# Patient Record
Sex: Male | Born: 1970 | Race: Black or African American | Hispanic: No | Smoking: Never smoker
Health system: Southern US, Community
[De-identification: ages and names within clinical notes are randomized; demographics above are authoritative.]

## PROBLEM LIST (undated history)

## (undated) ENCOUNTER — Emergency Department (HOSPITAL_COMMUNITY): Admission: EM | Payer: Self-pay | Source: Home / Self Care

## (undated) HISTORY — PX: HAND SURGERY: SHX662

---

## 2006-02-15 ENCOUNTER — Emergency Department: Payer: Self-pay | Admitting: Emergency Medicine

## 2006-03-15 ENCOUNTER — Emergency Department: Payer: Self-pay | Admitting: Emergency Medicine

## 2006-08-10 ENCOUNTER — Emergency Department: Payer: Self-pay | Admitting: Emergency Medicine

## 2006-12-07 ENCOUNTER — Emergency Department: Payer: Self-pay | Admitting: Unknown Physician Specialty

## 2007-02-01 ENCOUNTER — Emergency Department: Payer: Self-pay | Admitting: Emergency Medicine

## 2007-12-25 ENCOUNTER — Emergency Department: Payer: Self-pay | Admitting: Emergency Medicine
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
## 2008-06-15 ENCOUNTER — Emergency Department: Payer: Self-pay | Admitting: Emergency Medicine

## 2008-12-10 ENCOUNTER — Emergency Department: Payer: Self-pay | Admitting: Emergency Medicine

## 2009-05-19 ENCOUNTER — Emergency Department: Payer: Self-pay | Admitting: Internal Medicine

## 2009-06-05 ENCOUNTER — Emergency Department: Payer: Self-pay | Admitting: Emergency Medicine

## 2010-02-08 ENCOUNTER — Encounter: Payer: Self-pay | Admitting: Orthopedic Surgery

## 2010-02-15 ENCOUNTER — Encounter: Payer: Self-pay | Admitting: Orthopedic Surgery

## 2010-03-18 ENCOUNTER — Encounter: Payer: Self-pay | Admitting: Orthopedic Surgery

## 2010-04-05 ENCOUNTER — Emergency Department: Payer: Self-pay | Admitting: Emergency Medicine

## 2010-07-03 ENCOUNTER — Emergency Department: Payer: Self-pay | Admitting: Internal Medicine

## 2011-11-19 ENCOUNTER — Emergency Department: Payer: Self-pay | Admitting: Emergency Medicine

## 2012-09-23 ENCOUNTER — Emergency Department: Payer: Self-pay | Admitting: Emergency Medicine

## 2013-11-01 ENCOUNTER — Emergency Department: Payer: Self-pay | Admitting: Emergency Medicine
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
## 2015-08-25 ENCOUNTER — Emergency Department
Admission: EM | Admit: 2015-08-25 | Discharge: 2015-08-25 | Disposition: A | Discharge status: Home / Self Care | Payer: Self-pay | Attending: Emergency Medicine | Admitting: Emergency Medicine

## 2015-08-25 DIAGNOSIS — G5691 Unspecified mononeuropathy of right upper limb: Secondary | ICD-10-CM | POA: Insufficient documentation

## 2015-08-25 MED ORDER — MELOXICAM 15 MG PO TABS: 15.0000 mg | ORAL_TABLET | Freq: Every day | ORAL | Status: DC

## 2015-08-25 MED ORDER — PREDNISONE 10 MG (21) PO TBPK: ORAL_TABLET | ORAL | Status: DC

## 2015-08-25 NOTE — ED Provider Notes (Signed)
[facility] Emergency Department Provider Note ____________________________________________  Time seen: Approximately 11:19 PM  I have reviewed the triage vital signs and the nursing notes.   HISTORY  Chief Complaint Hand Pain   HPI ALDYN TOON is a 44 y.o. male that required reconstructive surgery. He states that over the past month he has had an increase in pain and stiffness. He is losing his ability to grip things and today it caused him to drop a [place] cabinet while at work.   No past medical history on file.  There are no active problems to display for this patient.   No past surgical history on file.  Current Outpatient Rx  Name  Route  Sig  Dispense  Refill  . meloxicam (MOBIC) 15 MG tablet   Oral   Take 1 tablet (15 mg total) by mouth daily.   30 tablet   2   . predniSONE (STERAPRED UNI-PAK 21 TAB) 10 MG (21) TBPK tablet      Take 6 tablets on day 1 Take 5 tablets on day 2 Take 4 tablets on day 3 Take 3 tablets on day 4 Take 2 tablets on day 5 Take 1 tablet on day 6   21 tablet   0     Allergies Review of patient's allergies indicates no known allergies.  No family history on file.  Social History Social History  Substance Use Topics  . Smoking status: Not on file  . Smokeless tobacco: Not on file  . Alcohol Use: Not on file    Review of Systems Constitutional: No recent illness. Eyes: No visual changes. ENT: No sore throat. Cardiovascular: Denies chest pain or palpitations. Respiratory: Denies shortness of breath. Gastrointestinal: No abdominal pain.  Genitourinary: Negative for dysuria. Musculoskeletal: Pain in  right hand: Negative for rash. Neurological: Negative for headaches, focal weakness or numbness. 10-point ROS otherwise negative.  ____________________________________________   PHYSICAL EXAM:  VITAL SIGNS: ED Triage Vitals  Enc Vitals Group     BP 08/25/15 2108 123/92 mmHg     Pulse Rate  08/25/15 2108 92     Resp 08/25/15 2108 18     Temp 08/25/15 2108 98 F (36.7 C)     Temp Source 08/25/15 2108 Oral     SpO2 08/25/15 2108 100 %     Weight 08/25/15 2108 160 lb (72.576 kg)     Height 08/25/15 2108  (1.676 m)     Head Cir --      Peak Flow --      Pain Score 08/25/15 2108 6     Pain Loc --      Pain Edu? --      Excl. in GC? --     Constitutional: Alert and oriented. Well appearing and in no acute distress. Eyes: Conjunctivae are normal. EOMI. Head: Atraumatic. Nose: No congestion/rhinnorhea. Neck: No stridor.  Respiratory: Normal respiratory effort.   Musculoskeletal: Right hand with limited range of motion at baseline. Neurologic:  Normal speech and language. No gross focal neurologic deficits are appreciated. Speech is normal. No gait instability.  Skin:  Skin is warm, dry and intact. Atraumatic. Surgical scars noted to the right hand and wrist  Psychiatric: Mood and affect are normal. Speech and behavior are normal.  ____________________________________________   LABS (all labs ordered are listed, but only abnormal results are displayed)  Labs Reviewed - No data to display ____________________________________________  RADIOLOGYnot indicated  ____________________________________________   PROCEDURES  Procedure(s) performed:  Velcro cock-up splint applied to right hand by Raynelle Fanning, ER Tech.    ____________________________________________   INITIAL IMPRESSION / ASSESSMENT AND PLAN / ED COURSE  Pertinent labs & imaging results that were available during my care of the patient were reviewed by me and considered in my medical decision making (see chart for details).  Patient was encouraged to follow up with orthopedics. He would benefit greatly from physical therapy. He was advised to avoid lifting more than 25 pounds until he is able to retain range of motion of his hand. He was advised to return to the emergency department for symptoms that change  or worsen if he is unable schedule an appointment _________________________________________   FINAL CLINICAL IMPRESSION(S) / ED DIAGNOSES  Final diagnoses:  Neuropathy of hand, right       Chinita Pester, FNP 08/25/15 2332  Minna Antis, MD 08/26/15 [phone]

## 2015-08-25 NOTE — ED Notes (Signed)
Patient works for a [employer]. Began having trouble with his right hand grip about 45 days. Today hand locked up on him.

## 2015-08-25 NOTE — ED Notes (Signed)
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
## 2017-07-22 ENCOUNTER — Emergency Department
Admission: EM | Admit: 2017-07-22 | Discharge: 2017-07-22 | Disposition: A | Discharge status: Home / Self Care | Payer: Self-pay | Attending: Emergency Medicine | Admitting: Emergency Medicine

## 2017-07-22 DIAGNOSIS — Z79899 Other long term (current) drug therapy: Secondary | ICD-10-CM | POA: Insufficient documentation

## 2017-07-22 DIAGNOSIS — M545 Low back pain: Secondary | ICD-10-CM | POA: Insufficient documentation

## 2017-07-22 DIAGNOSIS — R109 Unspecified abdominal pain: Secondary | ICD-10-CM

## 2017-07-22 LAB — URINALYSIS, COMPLETE (UACMP) WITH MICROSCOPIC
BILIRUBIN URINE: NEGATIVE
Bacteria, UA: NONE SEEN
GLUCOSE, UA: NEGATIVE mg/dL
Hgb urine dipstick: NEGATIVE
KETONES UR: 5 mg/dL — AB
Leukocytes, UA: NEGATIVE
NITRITE: NEGATIVE
PH: 5 (ref 5.0–8.0)
Protein, ur: NEGATIVE mg/dL
Specific Gravity, Urine: 1.023 (ref 1.005–1.030)

## 2017-07-22 MED ORDER — TRAMADOL HCL 50 MG PO TABS: 50.0000 mg | ORAL_TABLET | Freq: Four times a day (QID) | ORAL | 0 refills | Status: AC | PRN

## 2017-07-22 MED ORDER — CYCLOBENZAPRINE HCL 10 MG PO TABS: 10.0000 mg | ORAL_TABLET | Freq: Three times a day (TID) | ORAL | 0 refills | Status: DC | PRN

## 2017-07-22 MED ORDER — CYCLOBENZAPRINE HCL 10 MG PO TABS
10.0000 mg | ORAL_TABLET | Freq: Once | ORAL | Status: AC
  Administered 2017-07-22: 10 mg via ORAL
  Filled 2017-07-22: qty 1

## 2017-07-22 MED ORDER — TRAMADOL HCL 50 MG PO TABS
50.0000 mg | ORAL_TABLET | Freq: Once | ORAL | Status: AC
Start: 2017-07-22 — End: 2017-07-22
  Administered 2017-07-22: 50 mg via ORAL
  Filled 2017-07-22: qty 1

## 2017-07-22 MED ORDER — IBUPROFEN 800 MG PO TABS
800.0000 mg | ORAL_TABLET | Freq: Once | ORAL | Status: AC
  Administered 2017-07-22: 800 mg via ORAL
  Filled 2017-07-22: qty 1

## 2017-07-22 MED ORDER — IBUPROFEN 600 MG PO TABS: 600.0000 mg | ORAL_TABLET | Freq: Three times a day (TID) | ORAL | 0 refills | Status: DC | PRN

## 2017-07-22 NOTE — ED Triage Notes (Signed)
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 Pt denies pain with urination, pt states that he can move a certain way and make the pain worse

## 2017-07-22 NOTE — ED Provider Notes (Signed)
[facility] Emergency Department Provider Note   ____________________________________________   First MD Initiated Contact with Patient 07/22/17 2012     (approximate)  I have reviewed the triage vital signs and the nursing notes.   HISTORY  Chief Complaint Flank Pain    HPI Bradley Chavez is a 46 y.o. male patient complaining of right flank pain for 2 days. 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 Patient also states urinary frequency but denies dysuria or urethral discharge. Patient states pain increase with certain movements.Patient rates his pain as a 6/10. Patient describes the pain as "achy". Patient denies radicular component to this back pain. No palliative measures for complaint.    No past medical history on file.  There are no active problems to display for this patient.   No past surgical history on file.  Prior to Admission medications   Medication Sig Start Date End Date Taking? Authorizing Provider  cyclobenzaprine (FLEXERIL) 10 MG tablet Take 1 tablet (10 mg total) by mouth 3 (three) times daily as needed. 07/22/17   Joni ReiningSmith, Edilia Ghuman K, PA-C  ibuprofen (ADVIL,MOTRIN) 600 MG tablet Take 1 tablet (600 mg total) by mouth every 8 (eight) hours as needed. 07/22/17   Joni ReiningSmith, Daeshaun Specht K, PA-C  meloxicam (MOBIC) 15 MG tablet Take 1 tablet (15 mg total) by mouth daily. 08/25/15   Triplett, Cari B, FNP  predniSONE (STERAPRED UNI-PAK 21 TAB) 10 MG (21) TBPK tablet Take 6 tablets on day 1 Take 5 tablets on day 2 Take 4 tablets on day 3 Take 3 tablets on day 4 Take 2 tablets on day 5 Take 1 tablet on day 6 08/25/15   Triplett, Cari B, FNP  traMADol (ULTRAM) 50 MG tablet Take 1 tablet (50 mg total) by mouth every 6 (six) hours as needed. 07/22/17 07/22/18  Joni ReiningSmith, Khayla Koppenhaver K, PA-C    Allergies Patient has no known allergies.  No family history on file.  Social History Social History  Substance Use Topics    . Smoking status: Not on file  . Smokeless tobacco: Not on file  . Alcohol use Not on file    Review of Systems  Constitutional: No fever/chills Eyes: No visual changes. ENT: No sore throat. Cardiovascular: Denies chest pain. Respiratory: Denies shortness of breath. Gastrointestinal: No abdominal pain.  No nausea, no vomiting.  No diarrhea.  No constipation. Genitourinary: Negative for dysuria. Musculoskeletal: Right flank pain. Skin: Negative for rash. Neurological: Negative for headaches, focal weakness or numbness.   ____________________________________________   PHYSICAL EXAM:  VITAL SIGNS: ED Triage Vitals  Enc Vitals Group     BP 07/22/17 1949 134/68     Pulse Rate 07/22/17 1949 93     Resp 07/22/17 1949 18     Temp 07/22/17 1949 98.6 F (37 C)     Temp Source 07/22/17 1949 Oral     SpO2 07/22/17 1949 97 %     Weight 07/22/17 1949 165 lb (74.8 kg)     Height 07/22/17 1949 5\' 7"  (1.702 m)     Head Circumference --      Peak Flow --      Pain Score 07/22/17 1948 6     Pain Loc --      Pain Edu? --      Excl. in GC? --     Constitutional: Alert and oriented. Well appearing and in no acute distress. Eyes: Conjunctivae are normal. PERRL. EOMI. Cardiovascular: Normal  rate, regular rhythm. Grossly normal heart sounds.  Good peripheral circulation. Respiratory: Normal respiratory effort.  No retractions. Lungs CTAB. Gastrointestinal: Soft and nontender. No distention. No abdominal bruits. No CVA tenderness. Genitourinary: Deferred  Musculoskeletal: No obvious spinal deformity. No CVA guarding. Patient decreased range of motion right lateral flexion movements. Patient has negative straight leg test. Paraspinal muscle spasm elicited with flexion movements. Neurologic:  Normal speech and language. No gross focal neurologic deficits are appreciated. No gait instability. Skin:  Skin is warm, dry and intact. No rash noted. Psychiatric: Mood and affect are normal. Speech  and behavior are normal.  ____________________________________________   LABS (all labs ordered are listed, but only abnormal results are displayed)  Labs Reviewed  URINALYSIS, COMPLETE (UACMP) WITH MICROSCOPIC - Abnormal; Notable for the following:       Result Value   Color, Urine YELLOW (*)    APPearance CLEAR (*)    Ketones, ur 5 (*)    Squamous Epithelial / LPF 0-5 (*)    All other components within normal limits   ____________________________________________  EKG   ____________________________________________  RADIOLOGY  No results found.  ____________________________________________   PROCEDURES  Procedure(s) performed: None  Procedures  Critical Care performed: No  ____________________________________________   INITIAL IMPRESSION / ASSESSMENT AND PLAN / ED COURSE  Pertinent labs & imaging results that were available during my care of the patient were reviewed by me and considered in my medical decision making (see chart for details).  Patient present with right flank pain. Patient has a history of recurrent back pain secondary to repetitive heavy lifting. Patient urinalysis unremarkable. Patient given discharge care instructions and advised take medication as directed. Patient [facility] also [facility].      ____________________________________________   FINAL CLINICAL IMPRESSION(S) / ED DIAGNOSES  Final diagnoses:  Right flank pain      NEW MEDICATIONS STARTED DURING THIS VISIT:  Discharge Medication List as of 07/22/2017  8:22 PM    START taking these medications   Details  cyclobenzaprine (FLEXERIL) 10 MG tablet Take 1 tablet (10 mg total) by mouth 3 (three) times daily as needed., Starting Sun 07/22/2017, Print    ibuprofen (ADVIL,MOTRIN) 600 MG tablet Take 1 tablet (600 mg total) by mouth every 8 (eight) hours as needed., Starting Sun 07/22/2017, Print    traMADol (ULTRAM) 50 MG tablet Take 1  tablet (50 mg total) by mouth every 6 (six) hours as needed., Starting Sun 07/22/2017, Until Mon 07/22/2018, Print         Note:  This document was prepared using Dragon voice recognition software and may include unintentional dictation errors.    Joni ReiningSmith, Ronetta Molla K, PA-C 07/22/17 2035    Pershing ProudSchaevitz, Myra Rudeavid Matthew, MD 07/22/17 2224

## 2017-07-22 NOTE — ED Notes (Signed)
Pt complains of right lower back pain "off and on for a while, but it's worse today". Pt states pain is worse with bending, lifting, twisting. Pt denies pain down legs or change in sensation to extremities. resps unlabored. Pt denies dysuria.

## 2017-08-05 ENCOUNTER — Emergency Department
Admission: EM | Admit: 2017-08-05 | Discharge: 2017-08-05 | Disposition: A | Discharge status: Home / Self Care | Payer: Self-pay | Attending: Emergency Medicine | Admitting: Emergency Medicine

## 2017-08-05 DIAGNOSIS — K056 Periodontal disease, unspecified: Secondary | ICD-10-CM | POA: Insufficient documentation

## 2017-08-05 DIAGNOSIS — K0889 Other specified disorders of teeth and supporting structures: Secondary | ICD-10-CM | POA: Insufficient documentation

## 2017-08-05 DIAGNOSIS — Z79899 Other long term (current) drug therapy: Secondary | ICD-10-CM | POA: Insufficient documentation

## 2017-08-05 DIAGNOSIS — K047 Periapical abscess without sinus: Secondary | ICD-10-CM

## 2017-08-05 LAB — BASIC METABOLIC PANEL
ANION GAP: 9 (ref 5–15)
BUN: 5 mg/dL — ABNORMAL LOW (ref 6–20)
CHLORIDE: 105 mmol/L (ref 101–111)
CO2: 23 mmol/L (ref 22–32)
Calcium: 9.2 mg/dL (ref 8.9–10.3)
Creatinine, Ser: 1.05 mg/dL (ref 0.61–1.24)
GFR calc Af Amer: >60 mL/min (ref >60)
GFR calc non Af Amer: >60 mL/min (ref >60)
Glucose, Bld: 92 mg/dL (ref 65–99)
POTASSIUM: 4 mmol/L (ref 3.5–5.1)
Sodium: 137 mmol/L (ref 135–145)

## 2017-08-05 LAB — CBC WITH DIFFERENTIAL/PLATELET
BASOS ABS: 0.1 10*3/uL (ref 0–0.1)
Basophils Relative: 0 %
Eosinophils Absolute: 0.1 10*3/uL (ref 0–0.7)
Eosinophils Relative: 0 %
HEMATOCRIT: 46.6 % (ref 40.0–52.0)
Hemoglobin: 15.6 g/dL (ref 13.0–18.0)
LYMPHS ABS: 3.4 10*3/uL (ref 1.0–3.6)
Lymphocytes Relative: 18 %
MCH: 30.9 pg (ref 26.0–34.0)
MCHC: 33.5 g/dL (ref 32.0–36.0)
MCV: 92.1 fL (ref 80.0–100.0)
Monocytes Absolute: 1.4 10*3/uL — ABNORMAL HIGH (ref 0.2–1.0)
Monocytes Relative: 7 %
NEUTROS ABS: 13.7 10*3/uL — AB (ref 1.4–6.5)
Neutrophils Relative %: 75 %
Platelets: 212 10*3/uL (ref 150–440)
RBC: 5.06 MIL/uL (ref 4.40–5.90)
RDW: 13.4 % (ref 11.5–14.5)
WBC: 18.6 10*3/uL — AB (ref 3.8–10.6)

## 2017-08-05 LAB — LACTIC ACID, PLASMA: LACTIC ACID, VENOUS: 1.7 mmol/L (ref 0.5–1.9)

## 2017-08-05 MED ORDER — PENICILLIN V POTASSIUM 500 MG PO TABS: 500.0000 mg | ORAL_TABLET | Freq: Four times a day (QID) | ORAL | 0 refills | Status: DC

## 2017-08-05 MED ORDER — SODIUM CHLORIDE 0.9 % IV BOLUS (SEPSIS)
1000.0000 mL | Freq: Once | INTRAVENOUS | Status: AC
  Administered 2017-08-05: 1000 mL via INTRAVENOUS

## 2017-08-05 MED ORDER — TRAMADOL HCL 50 MG PO TABS: 50.0000 mg | ORAL_TABLET | Freq: Four times a day (QID) | ORAL | 0 refills | Status: AC | PRN

## 2017-08-05 MED ORDER — PENICILLIN V POTASSIUM 250 MG PO TABS
500.0000 mg | ORAL_TABLET | Freq: Once | ORAL | Status: AC
  Administered 2017-08-05: 500 mg via ORAL
  Filled 2017-08-05: qty 2

## 2017-08-05 MED ORDER — OXYCODONE-ACETAMINOPHEN 5-325 MG PO TABS
1.0000 | ORAL_TABLET | ORAL | Status: DC | PRN
  Administered 2017-08-05: 1 via ORAL

## 2017-08-05 MED ORDER — OXYCODONE-ACETAMINOPHEN 5-325 MG PO TABS
ORAL_TABLET | ORAL | Status: DC
Start: 2017-08-05 — End: 2017-08-05
  Filled 2017-08-05: qty 1

## 2017-08-05 NOTE — Discharge Instructions (Signed)
Please seek medical attention for any high fevers, chest pain, shortness of breath, change in behavior, persistent vomiting, bloody stool or any other new or concerning symptoms.  

## 2017-08-05 NOTE — ED Triage Notes (Signed)
Right sided facial swelling and dental pain X 3 days. No medications taken PTA.

## 2017-08-05 NOTE — ED Provider Notes (Signed)
[facility] Emergency Department Provider Note   ____________________________________________   I have reviewed the triage vital signs and the nursing notes.   HISTORY  Chief Complaint Dental Pain   History limited by: Not Limited   HPI Bradley Chavez is a 46 y.o. male who presents to the emergency department today because of concerns for dental pain. It is located on his right upper jaw. He's been having an ache for the past couple of days however this morning he noticed that he had more swelling. He states the pain is severe. He is having a hard time opening his mouth all the way. States he has had dental issues in the past but not for a while. Currently does not have a dentist.   History reviewed. No pertinent past medical history.  There are no active problems to display for this patient.   History reviewed. No pertinent surgical history.  Prior to Admission medications   Medication Sig Start Date End Date Taking? Authorizing Provider  cyclobenzaprine (FLEXERIL) 10 MG tablet Take 1 tablet (10 mg total) by mouth 3 (three) times daily as needed. 07/22/17   Joni Reining, PA-C  ibuprofen (ADVIL,MOTRIN) 600 MG tablet Take 1 tablet (600 mg total) by mouth every 8 (eight) hours as needed. 07/22/17   Joni Reining, PA-C  meloxicam (MOBIC) 15 MG tablet Take 1 tablet (15 mg total) by mouth daily. 08/25/15   Triplett, Cari B, FNP  predniSONE (STERAPRED UNI-PAK 21 TAB) 10 MG (21) TBPK tablet Take 6 tablets on day 1 Take 5 tablets on day 2 Take 4 tablets on day 3 Take 3 tablets on day 4 Take 2 tablets on day 5 Take 1 tablet on day 6 08/25/15   Triplett, Cari B, FNP  traMADol (ULTRAM) 50 MG tablet Take 1 tablet (50 mg total) by mouth every 6 (six) hours as needed. 07/22/17 07/22/18  Joni Reining, PA-C    Allergies Patient has no known allergies.  No family history on file.  Social History Social History  Substance Use Topics  . Smoking status: Never  Smoker  . Smokeless tobacco: Not on file  . Alcohol use No    Review of Systems Constitutional: No fever/chills Eyes: No visual changes. ENT: Right upper jaw pain and swelling.  Cardiovascular: Denies chest pain. Respiratory: Denies shortness of breath. Gastrointestinal: No abdominal pain.  No nausea, no vomiting.  No diarrhea.   Genitourinary: Negative for dysuria. Musculoskeletal: Negative for back pain. Skin: Negative for rash. Neurological: Negative for headaches, focal weakness or numbness.  ____________________________________________   PHYSICAL EXAM:  VITAL SIGNS: ED Triage Vitals  Enc Vitals Group     BP 08/05/17 0757 122/66     Pulse Rate 08/05/17 0757 (!) 129     Resp 08/05/17 0757 16     Temp 08/05/17 0757 100 F (37.8 C)     Temp Source 08/05/17 0757 Oral     SpO2 08/05/17 0757 95 %     Weight 08/05/17 0758 165 lb (74.8 kg)     Height 08/05/17 0758 5\' 7"  (1.702 m)     Head Circumference --      Peak Flow --      Pain Score 08/05/17 0757 7   Constitutional: Alert and oriented. Well appearing and in no distress. Eyes: Conjunctivae are normal.  ENT   Head: Normocephalic and atraumatic.   Nose: No congestion/rhinnorhea.   Mouth/Throat: Mucous membranes are moist. Poor dentition. Some tenderness to palpation of  right upper molars.    Neck: No stridor. Hematological/Lymphatic/Immunilogical: No cervical lymphadenopathy. Cardiovascular: Normal rate, regular rhythm.  No murmurs, rubs, or gallops.  Respiratory: Normal respiratory effort without tachypnea nor retractions. Breath sounds are clear and equal bilaterally. No wheezes/rales/rhonchi. Gastrointestinal: Soft and non tender. No rebound. No guarding.  Genitourinary: Deferred Musculoskeletal: Normal range of motion in all extremities. No lower extremity edema. Neurologic:  Normal speech and language. No gross focal neurologic deficits are appreciated.  Skin:  Skin is warm, dry and intact. No rash  noted. Psychiatric: Mood and affect are normal. Speech and behavior are normal. Patient exhibits appropriate insight and judgment.  ____________________________________________    LABS (pertinent positives/negatives)  Labs Reviewed  CBC WITH DIFFERENTIAL/PLATELET - Abnormal; Notable for the following:       Result Value   WBC 18.6 (*)    Neutro Abs 13.7 (*)    Monocytes Absolute 1.4 (*)    All other components within normal limits  BASIC METABOLIC PANEL - Abnormal; Notable for the following:    BUN 5 (*)    All other components within normal limits  LACTIC ACID, PLASMA     ____________________________________________   EKG  None  ____________________________________________    RADIOLOGY  None  ____________________________________________   PROCEDURES  Procedures  ____________________________________________   INITIAL IMPRESSION / ASSESSMENT AND PLAN / ED COURSE  Pertinent labs & imaging results that were available during my care of the patient were reviewed by me and considered in my medical decision making (see chart for details).  Patient presented to the emergency department today because of concerns for dental pain and swelling. Patient did have a slight leukocytosis however lactic acid level is normal. Patient's heart rate did improve with IV fluids. Will treat for dental infection. Will give patient dental follow-up information.  ____________________________________________   FINAL CLINICAL IMPRESSION(S) / ED DIAGNOSES  Final diagnoses:  Dental infection     Note: This dictation was prepared with Dragon dictation. Any transcriptional errors that result from this process are unintentional     Phineas Semen, MD 08/05/17 [phone]

## 2017-08-05 NOTE — ED Notes (Signed)
Patient presents to the ED with jaw swelling to his right jaw that began yesterday and dental pain x 3 days.  Patient denies having a regular dentist or history of dental issues.  Patient is in no obvious distress at this time.

## 2017-09-06 ENCOUNTER — Encounter: Payer: Self-pay | Admitting: *Deleted

## 2017-09-06 ENCOUNTER — Emergency Department
Admission: EM | Admit: 2017-09-06 | Discharge: 2017-09-06 | Disposition: A | Discharge status: Home / Self Care | Payer: Self-pay | Attending: Emergency Medicine | Admitting: Emergency Medicine

## 2017-09-06 DIAGNOSIS — Y999 Unspecified external cause status: Secondary | ICD-10-CM | POA: Insufficient documentation

## 2017-09-06 DIAGNOSIS — X58XXXA Exposure to other specified factors, initial encounter: Secondary | ICD-10-CM | POA: Insufficient documentation

## 2017-09-06 DIAGNOSIS — S60455A Superficial foreign body of left ring finger, initial encounter: Secondary | ICD-10-CM | POA: Insufficient documentation

## 2017-09-06 DIAGNOSIS — Y929 Unspecified place or not applicable: Secondary | ICD-10-CM | POA: Insufficient documentation

## 2017-09-06 DIAGNOSIS — S60459A Superficial foreign body of unspecified finger, initial encounter: Secondary | ICD-10-CM

## 2017-09-06 DIAGNOSIS — Y939 Activity, unspecified: Secondary | ICD-10-CM | POA: Insufficient documentation

## 2017-09-06 NOTE — ED Triage Notes (Signed)
Pt need ring cut off left ring finger.  Pt put wife's ring on this morning and can't get it off.  Pt has increased pain.  Pt alert.

## 2017-09-06 NOTE — ED Notes (Signed)
See triage note states he needs ring cut off  Finger is swollen and tender

## 2017-09-06 NOTE — ED Provider Notes (Signed)
[facility] Emergency Department Provider Note  ____________________________________________  Time seen: Approximately 6:19 PM  I have reviewed the triage vital signs and the nursing notes.   HISTORY  Chief Complaint needs ring cut off    HPI Bradley Chavez is a 46 y.o. male who presents to emergency department complaining of a ring stuck on his left ring finger. Patient reports that he place his wife's ring on his finger and was unable to remove same. Patient reports pain to the area. He has full range of motion to the digit. Attempt at home have been unsuccessful in removing the ring. No other injury or complaint.   History reviewed. No pertinent past medical history.  There are no active problems to display for this patient.   History reviewed. No pertinent surgical history.  Prior to Admission medications   Medication Sig Start Date End Date Taking? Authorizing Provider  cyclobenzaprine (FLEXERIL) 10 MG tablet Take 1 tablet (10 mg total) by mouth 3 (three) times daily as needed. 07/22/17   Joni Reining, PA-C  ibuprofen (ADVIL,MOTRIN) 600 MG tablet Take 1 tablet (600 mg total) by mouth every 8 (eight) hours as needed. 07/22/17   Joni Reining, PA-C  meloxicam (MOBIC) 15 MG tablet Take 1 tablet (15 mg total) by mouth daily. 08/25/15   Triplett, Rulon Eisenmenger B, FNP  penicillin v potassium (VEETID) 500 MG tablet Take 1 tablet (500 mg total) by mouth 4 (four) times daily. 08/05/17   Phineas Semen, MD  predniSONE (STERAPRED UNI-PAK 21 TAB) 10 MG (21) TBPK tablet Take 6 tablets on day 1 Take 5 tablets on day 2 Take 4 tablets on day 3 Take 3 tablets on day 4 Take 2 tablets on day 5 Take 1 tablet on day 6 08/25/15   Triplett, Cari B, FNP  traMADol (ULTRAM) 50 MG tablet Take 1 tablet (50 mg total) by mouth every 6 (six) hours as needed. 07/22/17 07/22/18  Joni Reining, PA-C  traMADol (ULTRAM) 50 MG tablet Take 1 tablet (50 mg total) by mouth every 6 (six) hours as  needed. 08/05/17 08/05/18  Phineas Semen, MD    Allergies Patient has no known allergies.  History reviewed. No pertinent family history.  Social History Social History  Substance Use Topics  . Smoking status: Never Smoker  . Smokeless tobacco: Never Used  . Alcohol use Yes     Review of Systems  Constitutional: No fever/chills Cardiovascular: no chest pain. Respiratory: no cough. No SOB. Musculoskeletal: Negative for musculoskeletal pain. Skin: Negative for rash, abrasions, lacerations, ecchymosis. Positive for ring stuck on the left fourth digit. Neurological: Negative for headaches, focal weakness or numbness. 10-point ROS otherwise negative.  ____________________________________________   PHYSICAL EXAM:  VITAL SIGNS: ED Triage Vitals  Enc Vitals Group     BP 09/06/17 1740 133/87     Pulse Rate 09/06/17 1740 70     Resp 09/06/17 1740 18     Temp 09/06/17 1740 98.5 F (36.9 C)     Temp Source 09/06/17 1740 Oral     SpO2 09/06/17 1740 97 %     Weight 09/06/17 1741 165 lb (74.8 kg)     Height 09/06/17 1741  (1.676 m)     Head Circumference --      Peak Flow --      Pain Score 09/06/17 1743 7     Pain Loc --      Pain Edu? --      Excl. in  GC? --      Constitutional: Alert and oriented. Well appearing and in no acute distress. Eyes: Conjunctivae are normal. PERRL. EOMI. Head: Atraumatic. Neck: No stridor.    Cardiovascular: Normal rate, regular rhythm. Normal S1 and S2.  Good peripheral circulation. Respiratory: Normal respiratory effort without tachypnea or retractions. Lungs CTAB. Good air entry to the bases with no decreased or absent breath sounds. Musculoskeletal: Full range of motion to all extremities. No gross deformities appreciated.In place running to the fourth digit. Mild surrounding erythema and edema and edema. No lacerations. No other injury. Patient has full range of motion to the finger. Sensation and cap refill intact both prior to and  status post removal of ring. Neurologic:  Normal speech and language. No gross focal neurologic deficits are appreciated.  Skin:  Skin is warm, dry and intact. No rash noted. Psychiatric: Mood and affect are normal. Speech and behavior are normal. Patient exhibits appropriate insight and judgement.   ____________________________________________   LABS (all labs ordered are listed, but only abnormal results are displayed)  Labs Reviewed - No data to display ____________________________________________  EKG   ____________________________________________  RADIOLOGY   No results found.  ____________________________________________    PROCEDURES  Procedure(s) performed:    .Foreign Body Removal Date/Time: 09/06/2017 6:21 PM Performed by: Gala Romney D Authorized by: Gala Romney D  Consent: Verbal consent obtained. Risks and benefits: risks, benefits and alternatives were discussed Consent given by: patient Patient identity confirmed: verbally with patient Body area: skin General location: upper extremity Location details: left ring finger  Sedation: Patient sedated: no Patient restrained: no Patient cooperative: yes Localization method: visualized Complexity: simple 1 objects recovered. Objects recovered: ring Post-procedure assessment: foreign body removed Patient tolerance: Patient tolerated the procedure well with no immediate complications Comments: In-place ring is removed using manual ring cutter. No trauma to the patient's digit. Full range of motion, capillary refill, sensation status post removal of ring.      Medications - No data to display   ____________________________________________   INITIAL IMPRESSION / ASSESSMENT AND PLAN / ED COURSE  Pertinent labs & imaging results that were available during my care of the patient were reviewed by me and considered in my medical decision making (see chart for details).  Review of the  [place] CSRS was performed in accordance of the [facility] prior to dispensing any controlled drugs.     Patient's diagnosis is consistent with ring stuck on the left finger. This is successfully removed. No complications. Tylenol and Motrin as needed at home. No prescriptions at this time. Patient will follow up primary care as needed. Patient is given ED precautions to return to the ED for any worsening or new symptoms.     ____________________________________________  FINAL CLINICAL IMPRESSION(S) / ED DIAGNOSES  Final diagnoses:  Foreign body of finger      NEW MEDICATIONS STARTED DURING THIS VISIT:  New Prescriptions   No medications on file        This chart was dictated using voice recognition software/Dragon. Despite best efforts to proofread, errors can occur which can change the meaning. Any change was purely unintentional.    Racheal Patches, PA-C 09/06/17 1826    Myrna Blazer, MD 09/06/17 [id number]

## 2020-08-01 ENCOUNTER — Emergency Department: Admission: EM | Admit: 2020-08-01 | Discharge: 2020-08-01 | Discharge status: Against Medical Advice | Payer: Self-pay

## 2020-08-04 ENCOUNTER — Other Ambulatory Visit: Payer: Self-pay

## 2020-08-04 ENCOUNTER — Emergency Department: Payer: Self-pay

## 2020-08-04 ENCOUNTER — Emergency Department
Admission: EM | Admit: 2020-08-04 | Discharge: 2020-08-04 | Disposition: A | Discharge status: Home / Self Care | Payer: Self-pay | Attending: Emergency Medicine | Admitting: Emergency Medicine

## 2020-08-04 DIAGNOSIS — M25512 Pain in left shoulder: Secondary | ICD-10-CM | POA: Insufficient documentation

## 2020-08-04 MED ORDER — IBUPROFEN 600 MG PO TABS: 600.0000 mg | ORAL_TABLET | Freq: Four times a day (QID) | ORAL | 0 refills | Status: DC | PRN

## 2020-08-04 MED ORDER — BACLOFEN 5 MG PO TABS: 5.0000 mg | ORAL_TABLET | Freq: Three times a day (TID) | ORAL | 0 refills | Status: DC | PRN

## 2020-08-04 NOTE — ED Triage Notes (Signed)
Shoulder pain x 1 week. Pt states progressively gotten worse over last week. Reports being unable to lift shoulder past horizontal plane without causing pain. NAD noted at this time.

## 2020-08-04 NOTE — ED Provider Notes (Signed)
[facility] Emergency Department Provider Note  ____________________________________________  Time seen: Approximately 4:57 PM  I have reviewed the triage vital signs and the nursing notes.   HISTORY  Chief Complaint Shoulder Pain    HPI Bradley Chavez is a 49 y.o. male that presents to the emergency department for evaluation of left shoulder pain for 1 week.  Patient states that it is painful to raise his shoulder up over his head.  He also states that he has a knot to the back of his right hand that is intermittently sore.  He is not having any pain to his hand today.  No traumas.  Patient does move furniture for work.  No shortness of breath, chest pain.   History reviewed. No pertinent past medical history.  There are no problems to display for this patient.   History reviewed. No pertinent surgical history.  Prior to Admission medications   Medication Sig Start Date End Date Taking? Authorizing Provider  Baclofen 5 MG TABS Take 5 mg by mouth 3 (three) times daily as needed. 08/04/20   Enid Derry, PA-C  ibuprofen (ADVIL) 600 MG tablet Take 1 tablet (600 mg total) by mouth every 6 (six) hours as needed. 08/04/20   Enid Derry, PA-C    Allergies Patient has no known allergies.  History reviewed. No pertinent family history.  Social History Social History   Tobacco Use  . Smoking status: Never Smoker  . Smokeless tobacco: Never Used  Substance Use Topics  . Alcohol use: Yes  . Drug use: No     Review of Systems  Cardiovascular: No chest pain. Respiratory: No cough. No SOB. Gastrointestinal: No abdominal pain.  No nausea, no vomiting.  Musculoskeletal: Positive for shoulder pain. Skin: Negative for rash, abrasions, lacerations, ecchymosis. Neurological: Negative for headaches, numbness or tingling   ____________________________________________   PHYSICAL EXAM:  VITAL SIGNS: ED Triage Vitals  Enc Vitals Group     BP  08/04/20 1544 (!) 133/93     Pulse Rate 08/04/20 1544 98     Resp 08/04/20 1544 18     Temp 08/04/20 1544 98.5 F (36.9 C)     Temp Source 08/04/20 1544 Oral     SpO2 08/04/20 1544 98 %     Weight 08/04/20 1544 165 lb (74.8 kg)     Height 08/04/20 1544 5\' 6"  (1.676 m)     Head Circumference --      Peak Flow --      Pain Score 08/04/20 1549 4     Pain Loc --      Pain Edu? --      Excl. in GC? --      Constitutional: Alert and oriented. Well appearing and in no acute distress. Eyes: Conjunctivae are normal. PERRL. EOMI. Head: Atraumatic. ENT:      Ears:      Nose: No congestion/rhinnorhea.      Mouth/Throat: Mucous membranes are moist.  Neck: No stridor. No cervical spine tenderness to palpation. Cardiovascular: Normal rate, regular rhythm.  Good peripheral circulation. Respiratory: Normal respiratory effort without tachypnea or retractions. Lungs CTAB. Good air entry to the bases with no decreased or absent breath sounds. Gastrointestinal: Bowel sounds 4 quadrants. Soft and nontender to palpation. No guarding or rigidity. No palpable masses. No distention.  Musculoskeletal: Full range of motion to all extremities. No gross deformities appreciated.  Pain with range of motion of left shoulder.  Pain elicited with abduction of left shoulder.  Soft  mass felt to right wrist.  No tenderness to palpation.  No overlying skin changes. Neurologic:  Normal speech and language. No gross focal neurologic deficits are appreciated.  Skin:  Skin is warm, dry and intact. No rash noted. Psychiatric: Mood and affect are normal. Speech and behavior are normal. Patient exhibits appropriate insight and judgement.   ____________________________________________   LABS (all labs ordered are listed, but only abnormal results are displayed)  Labs Reviewed - No data to display ____________________________________________  EKG   ____________________________________________  RADIOLOGY Lexine Baton, personally viewed and evaluated these images (plain radiographs) as part of my medical decision making, as well as reviewing the written report by the radiologist.   DG Shoulder Left  Result Date: 08/04/2020 CLINICAL DATA:  Left anterior shoulder pain for 5 days EXAM: LEFT SHOULDER - 2+ VIEW COMPARISON:  None. FINDINGS: Frontal, transscapular, and axillary views of the left shoulder are obtained. No fracture, subluxation, or dislocation. Mild spurring of the acromion process. Joint spaces are well preserved. Left chest is clear. IMPRESSION: 1. No acute bony abnormality. 2. Mild hypertrophic changes of the acromion process. Electronically Signed   By: Sharlet Salina M.D.   On: 08/04/2020 17:51    ____________________________________________    PROCEDURES  Procedure(s) performed:    Procedures    Medications - No data to display   ____________________________________________   INITIAL IMPRESSION / ASSESSMENT AND PLAN / ED COURSE  Pertinent labs & imaging results that were available during my care of the patient were reviewed by me and considered in my medical decision making (see chart for details).  Review of the [place] CSRS was performed in accordance of the [facility] prior to dispensing any controlled drugs.    Patient presented the emergency department for evaluation of left shoulder pain for 1 week.  Vital signs and exam are reassuring.  X-ray negative for acute bony abnormalities.  Patient may have injured a rotator cuff.  Mass to his hand is consistent with a cyst.  Patient will be discharged home with prescriptions for Motrin and baclofen.  Patient is to follow up with orthopedics as directed. Patient is given ED precautions to return to the ED for any worsening or new symptoms.   Bradley Chavez was evaluated in Emergency Department on 08/04/2020 for the symptoms described in the history of present illness. He was evaluated in the context of the global COVID-19 pandemic,  which necessitated consideration that the patient might be at risk for infection with the SARS-CoV-2 virus that causes COVID-19. Institutional protocols and algorithms that pertain to the evaluation of patients at risk for COVID-19 are in a state of rapid change based on information released by regulatory bodies including the CDC and federal and state organizations. These policies and algorithms were followed during the patient's care in the ED.  ____________________________________________  FINAL CLINICAL IMPRESSION(S) / ED DIAGNOSES  Final diagnoses:  Acute pain of left shoulder      NEW MEDICATIONS STARTED DURING THIS VISIT:  ED Discharge Orders         Ordered    ibuprofen (ADVIL) 600 MG tablet  Every 6 hours PRN     Discontinue  Reprint     08/04/20 1806    Baclofen 5 MG TABS  3 times daily PRN     Discontinue  Reprint     08/04/20 1806              This chart was dictated using voice recognition software/Dragon. Despite best  efforts to proofread, errors can occur which can change the meaning. Any change was purely unintentional.    Enid Derry, PA-C 08/04/20 1939    Dionne Bucy, MD 08/04/20 2337

## 2020-08-04 NOTE — ED Notes (Signed)
See triage note.. presents with left shoulder pain and hand numbness  States he has not had an injury  No deformity noted  Good pulses

## 2020-10-17 ENCOUNTER — Telehealth: Payer: Self-pay | Admitting: Emergency Medicine

## 2020-10-17 ENCOUNTER — Emergency Department
Admission: EM | Admit: 2020-10-17 | Discharge: 2020-10-17 | Disposition: A | Discharge status: Home / Self Care | Payer: Self-pay | Attending: Emergency Medicine | Admitting: Emergency Medicine

## 2020-10-17 ENCOUNTER — Other Ambulatory Visit: Payer: Self-pay

## 2020-10-17 DIAGNOSIS — A749 Chlamydial infection, unspecified: Secondary | ICD-10-CM | POA: Insufficient documentation

## 2020-10-17 DIAGNOSIS — T6591XA Toxic effect of unspecified substance, accidental (unintentional), initial encounter: Secondary | ICD-10-CM | POA: Insufficient documentation

## 2020-10-17 DIAGNOSIS — T304 Corrosion of unspecified body region, unspecified degree: Secondary | ICD-10-CM

## 2020-10-17 LAB — URINALYSIS, COMPLETE (UACMP) WITH MICROSCOPIC
Bacteria, UA: NONE SEEN
Bilirubin Urine: NEGATIVE
Glucose, UA: NEGATIVE mg/dL
Ketones, ur: NEGATIVE mg/dL
Leukocytes,Ua: NEGATIVE
Nitrite: NEGATIVE
Protein, ur: 100 mg/dL — AB
Specific Gravity, Urine: 1.011 (ref 1.005–1.030)
pH: 5 (ref 5.0–8.0)

## 2020-10-17 LAB — CHLAMYDIA/NGC RT PCR (ARMC ONLY)
Chlamydia Tr: DETECTED — AB
N gonorrhoeae: NOT DETECTED

## 2020-10-17 MED ORDER — DOXYCYCLINE HYCLATE 100 MG PO CAPS: 100.0000 mg | ORAL_CAPSULE | Freq: Two times a day (BID) | ORAL | 0 refills | Status: DC

## 2020-10-17 NOTE — ED Triage Notes (Signed)
Pt spilled some chemicals at work on groin area. Swelling and pain to left side of groin. Painful when urinating as well.

## 2020-10-17 NOTE — ED Provider Notes (Signed)
[facility] Emergency Department Provider Note  ____________________________________________   First MD Initiated Contact with Patient 10/17/20 0820     (approximate)  I have reviewed the triage vital signs and the nursing notes.   HISTORY  Chief Complaint chemical exposure   HPI Bradley Chavez is a 49 y.o. male presents to the ED with complaint of chemicals that were spilled onto him while at work.  He does not wish to file Workmen's Comp. for this injury.  He states that the skin in the right groin area has irritation.  Patient states he was able to wash this area immediately after the spill.  He also complains of painful urination.  He denies any fever, chills, nausea or vomiting.  He denies any penile discharge.  Patient does report that he has had GC and chlamydia in the past.  Currently rates his pain as 7 out of 10.      History reviewed. No pertinent past medical history.  There are no problems to display for this patient.   History reviewed. No pertinent surgical history.  Prior to Admission medications   Medication Sig Start Date End Date Taking? Authorizing Provider  Baclofen 5 MG TABS Take 5 mg by mouth 3 (three) times daily as needed. 08/04/20   Enid Derry, PA-C  doxycycline (VIBRAMYCIN) 100 MG capsule Take 1 capsule (100 mg total) by mouth 2 (two) times daily. 10/17/20   Tommi Rumps, PA-C  ibuprofen (ADVIL) 600 MG tablet Take 1 tablet (600 mg total) by mouth every 6 (six) hours as needed. 08/04/20   Enid Derry, PA-C    Allergies Patient has no known allergies.  History reviewed. No pertinent family history.  Social History Social History   Tobacco Use  . Smoking status: Never Smoker  . Smokeless tobacco: Never Used  Substance Use Topics  . Alcohol use: Yes  . Drug use: No    Review of Systems Constitutional: No fever/chills Cardiovascular: Denies chest pain. Respiratory: Denies shortness of  breath. Gastrointestinal: No abdominal pain.  No nausea, no vomiting.  Genitourinary: Positive for dysuria. Musculoskeletal: Negative for muscle skeletal pain. Skin: Positive for skin irritation secondary to recent chemical exposure. Neurological: Negative for headaches, focal weakness or numbness. ____________________________________________   PHYSICAL EXAM:  VITAL SIGNS: ED Triage Vitals  Enc Vitals Group     BP 10/17/20 0803 (!) 148/95     Pulse Rate 10/17/20 0803 (!) 105     Resp 10/17/20 0803 18     Temp 10/17/20 0803 98.7 F (37.1 C)     Temp Source 10/17/20 0803 Oral     SpO2 10/17/20 0803 99 %     Weight 10/17/20 0804 165 lb (74.8 kg)     Height 10/17/20 0804 5\' 6"  (1.676 m)     Head Circumference --      Peak Flow --      Pain Score 10/17/20 0804 7     Pain Loc --      Pain Edu? --      Excl. in GC? --     Constitutional: Alert and oriented. Well appearing and in no acute distress. Eyes: Conjunctivae are normal.  Head: Atraumatic. Neck: No stridor.   Cardiovascular: Normal rate, regular rhythm. Grossly normal heart sounds.  Good peripheral circulation. Respiratory: Normal respiratory effort.  No retractions. Lungs CTAB. Gastrointestinal: Soft and nontender. No distention.  No penile discharge was noted. Musculoskeletal: Moves upper and lower extremities without any difficulty.  Normal gait  was noted. Neurologic:  Normal speech and language. No gross focal neurologic deficits are appreciated. No gait instability. Skin:  Skin is warm, dry.  There is skin irritation but no open areas present in the groin area.  There is no vesicles. Psychiatric: Mood and affect are normal. Speech and behavior are normal.  ____________________________________________   LABS (all labs ordered are listed, but only abnormal results are displayed)  Labs Reviewed  CHLAMYDIA/NGC RT PCR ([facility] ONLY) - Abnormal; Notable for the following components:      Result Value    Chlamydia Tr DETECTED (*)    All other components within normal limits  URINALYSIS, COMPLETE (UACMP) WITH MICROSCOPIC - Abnormal; Notable for the following components:   Color, Urine YELLOW (*)    APPearance CLEAR (*)    Hgb urine dipstick SMALL (*)    Protein, ur 100 (*)    All other components within normal limits   ____________________________________________  EKG    PROCEDURES  Procedure(s) performed (including Critical Care):  Procedures   ____________________________________________   INITIAL IMPRESSION / ASSESSMENT AND PLAN / ED COURSE  As part of my medical decision making, I reviewed the following data within the electronic MEDICAL RECORD NUMBER Notes from prior ED visits and [place] Controlled Substance Database  ----------------------------------------- 1:45 PM on 10/17/2020 ----------------------------------------- Message was left for patient to return phone call to discussed lab results.  50 year old male presents to the ED with complaints of skin irritation in the groin area secondary to a chemical spill yesterday while at work.  Patient states he was able to wash this off immediately.  He wishes not to file this as a Workmen's Comp. injury.  Patient also has dysuria, he reports that he has had STDs before..  Urinalysis was negative however GC and chlamydia results were positive for chlamydia.  Patient was called and doxycycline 100 mg twice daily for 7 days was sent to the pharmacy.  Patient is aware that he needs to inform his partner so that they can also be treated. ____________________________________________   FINAL CLINICAL IMPRESSION(S) / ED DIAGNOSES  Final diagnoses:  Chemical burn of skin  Chlamydia infection     ED Discharge Orders    None      *Please note:  Bradley Chavez was evaluated in Emergency Department on 10/17/2020 for the symptoms described in the history of present illness. He was evaluated in the context of the global COVID-19  pandemic, which necessitated consideration that the patient might be at risk for infection with the SARS-CoV-2 virus that causes COVID-19. Institutional protocols and algorithms that pertain to the evaluation of patients at risk for COVID-19 are in a state of rapid change based on information released by regulatory bodies including the CDC and federal and state organizations. These policies and algorithms were followed during the patient's care in the ED.  Some ED evaluations and interventions may be delayed as a result of limited staffing during and the pandemic.*   Note:  This document was prepared using Dragon voice recognition software and may include unintentional dictation errors.    Tommi Rumps, PA-C 10/17/20 1459    Minna Antis, MD 10/17/20 1511

## 2020-10-17 NOTE — ED Notes (Signed)
Pt presents to the ED for groin pain and swelling. Pt spilled and unknown chemical on himself while at work. Pt states it burns and is painful to urinate. Denies any d/c or urgency. Pt is A&Ox4 and NAD.

## 2020-10-17 NOTE — ED Provider Notes (Signed)
-----------------------------------------   2:50 PM on 10/17/2020 ----------------------------------------- Patient called with information as to what pharmacy he would like to have his medication sent today.  Patient made aware that his test was positive for chlamydia.  Also he needs to contact his partner for treatment as well.    Tommi Rumps, PA-C 10/17/20 1456    Minna Antis, MD 10/17/20 1511

## 2020-10-17 NOTE — Discharge Instructions (Signed)
Clean groin area daily with mild soap and water and watch for any signs of infection. Also you may apply a thin coat of Neosporin to the area for the next 2 days which may help with some irritation. Increase fluids. If you continue to have urinary symptoms you should follow-up with your primary care provider or go to [facility] acute care to be seen. At the time of your discharge your urine test is negative for any bacteria however the culture is still pending. You can see the results on my chart.

## 2020-10-18 NOTE — Telephone Encounter (Cosign Needed)
Spoke with patient on the phone about test results and medication needed

## 2020-10-24 ENCOUNTER — Other Ambulatory Visit: Payer: Self-pay

## 2020-10-24 ENCOUNTER — Emergency Department
Admission: EM | Admit: 2020-10-24 | Discharge: 2020-10-24 | Disposition: A | Discharge status: Home / Self Care | Payer: Self-pay | Attending: Emergency Medicine | Admitting: Emergency Medicine

## 2020-10-24 DIAGNOSIS — A749 Chlamydial infection, unspecified: Secondary | ICD-10-CM | POA: Insufficient documentation

## 2020-10-24 LAB — URINALYSIS, COMPLETE (UACMP) WITH MICROSCOPIC
Bacteria, UA: NONE SEEN
Bilirubin Urine: NEGATIVE
Glucose, UA: NEGATIVE mg/dL
Hgb urine dipstick: NEGATIVE
Ketones, ur: NEGATIVE mg/dL
Leukocytes,Ua: NEGATIVE
Nitrite: NEGATIVE
Protein, ur: 100 mg/dL — AB
Specific Gravity, Urine: 1.019 (ref 1.005–1.030)
Squamous Epithelial / LPF: NONE SEEN (ref 0–5)
pH: 7 (ref 5.0–8.0)

## 2020-10-24 LAB — CHLAMYDIA/NGC RT PCR (ARMC ONLY)
Chlamydia Tr: NOT DETECTED
N gonorrhoeae: NOT DETECTED

## 2020-10-24 MED ORDER — CEFTRIAXONE SODIUM 1 G IJ SOLR
1.0000 g | Freq: Once | INTRAMUSCULAR | Status: AC
  Administered 2020-10-24: 1 g via INTRAMUSCULAR
  Filled 2020-10-24: qty 10

## 2020-10-24 MED ORDER — METRONIDAZOLE 500 MG PO TABS
1000.0000 mg | ORAL_TABLET | Freq: Once | ORAL | Status: AC
  Administered 2020-10-24: 1000 mg via ORAL
  Filled 2020-10-24: qty 2

## 2020-10-24 MED ORDER — AZITHROMYCIN 500 MG PO TABS
1000.0000 mg | ORAL_TABLET | Freq: Once | ORAL | Status: AC
  Administered 2020-10-24: 1000 mg via ORAL
  Filled 2020-10-24: qty 2

## 2020-10-24 NOTE — Discharge Instructions (Addendum)
No sexual intercourse for 1 week.  All partners need to be treated.

## 2020-10-24 NOTE — ED Provider Notes (Signed)
[facility] Emergency Department Provider Note  ____________________________________________  Time seen: Approximately 3:09 PM  I have reviewed the triage vital signs and the nursing notes.   HISTORY  Chief Complaint Dysuria    HPI Bradley Chavez is a 49 y.o. male that presents to the emergency department for evaluation of continued dysuria after testing positive for chlamydia 1 week ago.  Patient states that he was sent in a prescription for doxycycline and has finished the prescription.  He continues to have burning with urination.  No fevers.  History reviewed. No pertinent past medical history.  There are no problems to display for this patient.   History reviewed. No pertinent surgical history.  Prior to Admission medications   Medication Sig Start Date End Date Taking? Authorizing Provider  Baclofen 5 MG TABS Take 5 mg by mouth 3 (three) times daily as needed. 08/04/20   Enid Derry, PA-C  doxycycline (VIBRAMYCIN) 100 MG capsule Take 1 capsule (100 mg total) by mouth 2 (two) times daily. 10/17/20   Tommi Rumps, PA-C  doxycycline (VIBRAMYCIN) 100 MG capsule Take 1 capsule (100 mg total) by mouth 2 (two) times daily. 10/17/20   Tommi Rumps, PA-C  ibuprofen (ADVIL) 600 MG tablet Take 1 tablet (600 mg total) by mouth every 6 (six) hours as needed. 08/04/20   Enid Derry, PA-C    Allergies Patient has no known allergies.  History reviewed. No pertinent family history.  Social History Social History   Tobacco Use  . Smoking status: Never Smoker  . Smokeless tobacco: Never Used  Substance Use Topics  . Alcohol use: Yes  . Drug use: No     Review of Systems  Constitutional: No fever/chills Cardiovascular: No chest pain. Respiratory: No SOB. Gastrointestinal: No abdominal pain.  No nausea, no vomiting.  Genitourinary: Positive for dysuria Musculoskeletal: Negative for musculoskeletal pain. Skin: Negative for rash,  abrasions, lacerations, ecchymosis. Neurological: Negative for headaches   ____________________________________________   PHYSICAL EXAM:  VITAL SIGNS: ED Triage Vitals  Enc Vitals Group     BP 10/24/20 1334 130/79     Pulse Rate 10/24/20 1334 (!) 101     Resp 10/24/20 1334 14     Temp 10/24/20 1334 98.3 F (36.8 C)     Temp Source 10/24/20 1334 Oral     SpO2 10/24/20 1334 98 %     Weight --      Height --      Head Circumference --      Peak Flow --      Pain Score 10/24/20 1318 6     Pain Loc --      Pain Edu? --      Excl. in GC? --      Constitutional: Alert and oriented. Well appearing and in no acute distress. Eyes: Conjunctivae are normal. PERRL. EOMI. Head: Atraumatic. ENT:      Ears:      Nose: No congestion/rhinnorhea.      Mouth/Throat: Mucous membranes are moist.  Neck: No stridor. Cardiovascular: Normal rate, regular rhythm.  Good peripheral circulation. Respiratory: Normal respiratory effort without tachypnea or retractions. Lungs CTAB. Good air entry to the bases with no decreased or absent breath sounds. Musculoskeletal: Full range of motion to all extremities. No gross deformities appreciated. Neurologic:  Normal speech and language. No gross focal neurologic deficits are appreciated.  Skin:  Skin is warm, dry and intact. No rash noted. Psychiatric: Mood and affect are normal. Speech and behavior  are normal. Patient exhibits appropriate insight and judgement.   ____________________________________________   LABS (all labs ordered are listed, but only abnormal results are displayed)  Labs Reviewed  URINALYSIS, COMPLETE (UACMP) WITH MICROSCOPIC - Abnormal; Notable for the following components:      Result Value   Color, Urine YELLOW (*)    APPearance CLEAR (*)    Protein, ur 100 (*)    All other components within normal limits  CHLAMYDIA/NGC RT PCR ([facility] ONLY)    ____________________________________________  EKG   ____________________________________________  RADIOLOGY   No results found.  ____________________________________________    PROCEDURES  Procedure(s) performed:    Procedures    Medications  cefTRIAXone (ROCEPHIN) injection 1 g (1 g Intramuscular Given 10/24/20 1601)  azithromycin (ZITHROMAX) tablet 1,000 mg (1,000 mg Oral Given 10/24/20 1600)  metroNIDAZOLE (FLAGYL) tablet 1,000 mg (1,000 mg Oral Given 10/24/20 1600)     ____________________________________________   INITIAL IMPRESSION / ASSESSMENT AND PLAN / ED COURSE  Pertinent labs & imaging results that were available during my care of the patient were reviewed by me and considered in my medical decision making (see chart for details).  Review of the [place] CSRS was performed in accordance of the [facility] prior to dispensing any controlled drugs.   Patient presented to emergency department for evaluation of continued dysuria after positive chlamydia test last week.  Vital signs and exam are reassuring.  Patient just finished a course of doxycycline.  He will be given IM Rocephin, oral azithromycin and oral Flagyl empirically for STDs. Patient is to follow up with PCP as directed. Patient is given ED precautions to return to the ED for any worsening or new symptoms.  Bradley Chavez was evaluated in Emergency Department on 10/24/2020 for the symptoms described in the history of present illness. He was evaluated in the context of the global COVID-19 pandemic, which necessitated consideration that the patient might be at risk for infection with the SARS-CoV-2 virus that causes COVID-19. Institutional protocols and algorithms that pertain to the evaluation of patients at risk for COVID-19 are in a state of rapid change based on information released by regulatory bodies including the CDC and federal and state organizations. These policies and algorithms were followed during  the patient's care in the ED.   ____________________________________________  FINAL CLINICAL IMPRESSION(S) / ED DIAGNOSES  Final diagnoses:  Chlamydia      NEW MEDICATIONS STARTED DURING THIS VISIT:  ED Discharge Orders    None          This chart was dictated using voice recognition software/Dragon. Despite best efforts to proofread, errors can occur which can change the meaning. Any change was purely unintentional.    Enid Derry, PA-C 10/24/20 1717    Gilles Chiquito, MD 10/25/20 [phone]

## 2020-10-24 NOTE — ED Notes (Signed)
PA cleared pt for dc at this time

## 2020-10-24 NOTE — ED Triage Notes (Signed)
Pt presents via POV c/o dysuria after finishing abx for STD.

## 2020-11-09 ENCOUNTER — Encounter: Payer: Self-pay | Admitting: Physician Assistant

## 2020-11-09 ENCOUNTER — Ambulatory Visit: Payer: Self-pay | Admitting: Physician Assistant

## 2020-11-09 ENCOUNTER — Other Ambulatory Visit: Payer: Self-pay

## 2020-11-09 DIAGNOSIS — Z299 Encounter for prophylactic measures, unspecified: Secondary | ICD-10-CM

## 2020-11-09 DIAGNOSIS — Z113 Encounter for screening for infections with a predominantly sexual mode of transmission: Secondary | ICD-10-CM

## 2020-11-09 LAB — GRAM STAIN

## 2020-11-09 MED ORDER — DOXYCYCLINE HYCLATE 100 MG PO TABS: 100.0000 mg | ORAL_TABLET | Freq: Two times a day (BID) | ORAL | 0 refills | Status: DC

## 2020-11-09 NOTE — Progress Notes (Signed)
   [name]/screening visit  Subjective:  Bradley Chavez is a 49 y.o. male being seen today for an STI screening visit. The patient reports they do have symptoms.    Patient has the following medical conditions:  There are no problems to display for this patient.    Chief Complaint  Patient presents with  . SEXUALLY TRANSMITTED DISEASE    screening    HPI  Patient reports that he has had dysuria for 2 weeks.  Denies other symptoms, chronic conditions and regular medicines.  Reports last HIV test was in 2020.  Last void prior to sample collection for Gram stain was about 1 hr ago.  See flowsheet for further details and programmatic requirements.    The following portions of the patient's history were reviewed and updated as appropriate: allergies, current medications, past medical history, past social history, past surgical history and problem list.  Objective:  There were no vitals filed for this visit.  Physical Exam Constitutional:      General: He is not in acute distress.    Appearance: Normal appearance.  HENT:     Head: Normocephalic and atraumatic.     Comments: No nits,lice, or hair loss. No cervical, supraclavicular or axillary adenopathy.    Mouth/Throat:     Mouth: Mucous membranes are moist.     Pharynx: Oropharynx is clear. No oropharyngeal exudate or posterior oropharyngeal erythema.  Eyes:     Conjunctiva/sclera: Conjunctivae normal.  Pulmonary:     Effort: Pulmonary effort is normal.  Abdominal:     Palpations: Abdomen is soft. There is no mass.     Tenderness: There is no abdominal tenderness. There is no guarding or rebound.  Genitourinary:    Penis: Normal.      Testes: Normal.     Comments: Pubic area without nits, lice, hair loss, edema, erythema, lesions and inguinal adenopathy. Penis circumcised without rash, lesions and discharge at meatus. Musculoskeletal:     Cervical back: Neck supple. No  tenderness.  Skin:    General: Skin is warm and dry.     Findings: No bruising, erythema, lesion or rash.  Neurological:     Mental Status: He is alert and oriented to person, place, and time.  Psychiatric:        Mood and Affect: Mood normal.        Behavior: Behavior normal.        Thought Content: Thought content normal.        Judgment: Judgment normal.       Assessment and Plan:  Bradley Chavez is a 49 y.o. male presenting to the [facility] Department for STI screening  1. Screening for STD (sexually transmitted disease) Patient into clinic with symptoms. Patient declines blood work today. Rec condoms with all sex. Await test results.  Counseled that RN will call if needs to RTC for treatment once results are back. - Gram stain - Gonococcus culture  2. Prophylactic measure Will treat to cover for NGU due to patient symptoms with Doxycycline 100 mg #14 1 po BID for 7 days. No sex for 14 days and until after partner has been screened. Call with questions or concerns. - doxycycline (VIBRA-TABS) 100 MG tablet; Take 1 tablet (100 mg total) by mouth 2 (two) times daily.  Dispense: 14 tablet; Refill: 0     No follow-ups on file.  No future appointments.  Matt Holmes, PA

## 2020-11-13 LAB — GONOCOCCUS CULTURE

## 2020-11-14 NOTE — Progress Notes (Signed)
Chart reviewed by Pharmacist  Suzanne Walker PharmD, Contract Pharmacist at  [facility]  

## 2021-02-06 ENCOUNTER — Emergency Department: Payer: Self-pay

## 2021-02-06 ENCOUNTER — Emergency Department
Admission: EM | Admit: 2021-02-06 | Discharge: 2021-02-06 | Disposition: A | Discharge status: Home / Self Care | Payer: Self-pay | Attending: Emergency Medicine | Admitting: Emergency Medicine

## 2021-02-06 ENCOUNTER — Other Ambulatory Visit: Payer: Self-pay

## 2021-02-06 DIAGNOSIS — Y9355 Activity, bike riding: Secondary | ICD-10-CM | POA: Insufficient documentation

## 2021-02-06 DIAGNOSIS — S89301A Unspecified physeal fracture of lower end of right fibula, initial encounter for closed fracture: Secondary | ICD-10-CM | POA: Insufficient documentation

## 2021-02-06 DIAGNOSIS — S82831A Other fracture of upper and lower end of right fibula, initial encounter for closed fracture: Secondary | ICD-10-CM

## 2021-02-06 MED ORDER — HYDROCODONE-ACETAMINOPHEN 5-325 MG PO TABS
1.0000 | ORAL_TABLET | Freq: Four times a day (QID) | ORAL | 0 refills | Status: AC | PRN
Start: 2021-02-06 — End: ?
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
## 2021-02-06 NOTE — Discharge Instructions (Signed)
Call tomorrow to make an appointment with Dr. Excell Seltzer who is on-call for podiatry for further treatment of your nondisplaced fracture of your fibula. Ice and elevation frequently to reduce swelling and help with pain. Use crutches when walking. A prescription for pain medication was sent to your pharmacy.  You may also take ibuprofen with your pain medication if additional pain medication is needed.  Ibuprofen will help with inflammation.  Do not drive or operate machinery while taking this medication.

## 2021-06-13 ENCOUNTER — Encounter: Payer: Self-pay | Admitting: Physician Assistant

## 2021-06-13 ENCOUNTER — Ambulatory Visit: Payer: Self-pay | Admitting: Physician Assistant

## 2021-06-13 ENCOUNTER — Other Ambulatory Visit: Payer: Self-pay

## 2021-06-13 DIAGNOSIS — Z113 Encounter for screening for infections with a predominantly sexual mode of transmission: Secondary | ICD-10-CM

## 2021-06-13 DIAGNOSIS — Z299 Encounter for prophylactic measures, unspecified: Secondary | ICD-10-CM

## 2021-06-13 LAB — GRAM STAIN

## 2021-06-13 MED ORDER — DOXYCYCLINE HYCLATE 100 MG PO TABS: 100.0000 mg | ORAL_TABLET | Freq: Two times a day (BID) | ORAL | 0 refills | Status: AC

## 2021-06-13 NOTE — Progress Notes (Signed)
[facility] Department STI clinic/screening visit  Subjective:  Bradley Chavez is a 50 y.o. male being seen today for an STI screening visit. The patient reports they do have symptoms.    Patient has the following medical conditions:  There are no problems to display for this patient.    Chief Complaint  Patient presents with   SEXUALLY TRANSMITTED DISEASE    screening    HPI  Patient reports that he has had dysuria for about 1 week. Denies other symptoms, chronic conditions, surgeries and regular medicine.  Reports last HIV test was 3-4 years ago and last void prior to sample collection for Gram stain was about 2 hr ago.   See flowsheet for further details and programmatic requirements.    The following portions of the patient's history were reviewed and updated as appropriate: allergies, current medications, past medical history, past social history, past surgical history and problem list.  Objective:  There were no vitals filed for this visit.  Physical Exam Constitutional:      General: He is not in acute distress.    Appearance: Normal appearance.  HENT:     Head: Normocephalic and atraumatic.     Comments: No nits,lice, or hair loss. No cervical, supraclavicular or axillary adenopathy.     Mouth/Throat:     Mouth: Mucous membranes are moist.     Pharynx: Oropharynx is clear. No oropharyngeal exudate or posterior oropharyngeal erythema.  Eyes:     Conjunctiva/sclera: Conjunctivae normal.  Pulmonary:     Effort: Pulmonary effort is normal.  Abdominal:     Palpations: Abdomen is soft. There is no mass.     Tenderness: There is no abdominal tenderness. There is no guarding or rebound.  Genitourinary:    Penis: Normal.      Testes: Normal.     Comments: Pubic area without nits, lice, hair loss, edema, erythema, lesions and inguinal adenopathy. Penis circumcised without rash, lesions and discharge at meatus. Testicles descended bilaterally,nt, no  masses or edema.  Musculoskeletal:     Cervical back: Neck supple. No tenderness.  Skin:    General: Skin is warm and dry.     Findings: No bruising, erythema, lesion or rash.  Neurological:     Mental Status: He is alert and oriented to person, place, and time.  Psychiatric:        Mood and Affect: Mood normal.        Behavior: Behavior normal.        Thought Content: Thought content normal.        Judgment: Judgment normal.      Assessment and Plan:  [name] is a 50 y.o. male presenting to the [facility] Department for STI screening  1. Screening for STD (sexually transmitted disease) Patient into clinic with symptoms. Patient declines blood work today. Reviewed with patient Gram stain results.  Rec condoms with all sex. Await test results.  Counseled that RN will call if needs to RTC for treatment once results are back.  - Gram stain - Gonococcus culture  2. Prophylactic measure Due to patient symptoms and risks will cover for NGU with Doxycycline 100 mg #14 1 po BID for 7 days. No sex for 14 days. Counseled patient that he should have his partner/s get screened and call back if they test positive for an infection to see if he needs further treatment. - doxycycline (VIBRA-TABS) 100 MG tablet; Take 1 tablet (100 mg total) by mouth  2 (two) times daily for 7 days.  Dispense: 14 tablet; Refill: 0     No follow-ups on file.  No future appointments.  Matt Holmes, PA

## 2021-06-13 NOTE — Progress Notes (Signed)
50 year old male seen today for STI exam. Glenna Fellows, RN

## 2021-06-18 LAB — GONOCOCCUS CULTURE

## 2021-06-23 ENCOUNTER — Emergency Department: Payer: Self-pay

## 2021-06-23 ENCOUNTER — Other Ambulatory Visit: Payer: Self-pay

## 2021-06-23 ENCOUNTER — Emergency Department
Admission: EM | Admit: 2021-06-23 | Discharge: 2021-06-23 | Disposition: A | Discharge status: Home / Self Care | Payer: Self-pay | Attending: Emergency Medicine | Admitting: Emergency Medicine

## 2021-06-23 DIAGNOSIS — R251 Tremor, unspecified: Secondary | ICD-10-CM | POA: Insufficient documentation

## 2021-06-23 DIAGNOSIS — R519 Headache, unspecified: Secondary | ICD-10-CM | POA: Insufficient documentation

## 2021-06-23 DIAGNOSIS — A64 Unspecified sexually transmitted disease: Secondary | ICD-10-CM

## 2021-06-23 LAB — URINALYSIS, COMPLETE (UACMP) WITH MICROSCOPIC
Bacteria, UA: NONE SEEN
Bilirubin Urine: NEGATIVE
Glucose, UA: NEGATIVE mg/dL
Hgb urine dipstick: NEGATIVE
Ketones, ur: NEGATIVE mg/dL
Leukocytes,Ua: NEGATIVE
Nitrite: NEGATIVE
Protein, ur: NEGATIVE mg/dL
Specific Gravity, Urine: 1.003 — ABNORMAL LOW (ref 1.005–1.030)
Squamous Epithelial / HPF: NONE SEEN (ref 0–5)
pH: 6 (ref 5.0–8.0)

## 2021-06-23 LAB — HIV ANTIBODY (ROUTINE TESTING W REFLEX): HIV Screen 4th Generation wRfx: NONREACTIVE

## 2021-06-23 LAB — CBG MONITORING, ED: Glucose-Capillary: 155 mg/dL — ABNORMAL HIGH (ref 70–99)

## 2021-06-23 LAB — BASIC METABOLIC PANEL
Anion gap: 9 (ref 5–15)
BUN: 8 mg/dL (ref 6–20)
CO2: 24 mmol/L (ref 22–32)
Calcium: 9.2 mg/dL (ref 8.9–10.3)
Chloride: 104 mmol/L (ref 98–111)
Creatinine, Ser: 1.08 mg/dL (ref 0.61–1.24)
GFR, Estimated: >60 mL/min (ref >60)
Glucose, Bld: 173 mg/dL — ABNORMAL HIGH (ref 70–99)
Potassium: 3.7 mmol/L (ref 3.5–5.1)
Sodium: 137 mmol/L (ref 135–145)

## 2021-06-23 LAB — HEPATITIS PANEL, ACUTE
HCV Ab: NONREACTIVE
Hep A IgM: NONREACTIVE
Hep B C IgM: NONREACTIVE
Hepatitis B Surface Ag: NONREACTIVE

## 2021-06-23 LAB — CHLAMYDIA/NGC RT PCR (ARMC ONLY)
Chlamydia Tr: NOT DETECTED
N gonorrhoeae: NOT DETECTED

## 2021-06-23 MED ORDER — CEFTRIAXONE SODIUM 1 G IJ SOLR
500.0000 mg | Freq: Once | INTRAMUSCULAR | Status: AC
  Administered 2021-06-23: 500 mg via INTRAMUSCULAR
  Filled 2021-06-23: qty 10

## 2021-06-23 MED ORDER — DIPHENHYDRAMINE HCL 25 MG PO CAPS
25.0000 mg | ORAL_CAPSULE | Freq: Once | ORAL | Status: AC
  Administered 2021-06-23: 25 mg via ORAL
  Filled 2021-06-23: qty 1

## 2021-06-23 MED ORDER — AZITHROMYCIN 500 MG PO TABS
1000.0000 mg | ORAL_TABLET | Freq: Once | ORAL | Status: AC
  Administered 2021-06-23: 1000 mg via ORAL
  Filled 2021-06-23: qty 2

## 2021-06-23 NOTE — ED Triage Notes (Signed)
Pt states he wants to checked for a STD, states he is having penile pain with discharge

## 2021-06-23 NOTE — ED Notes (Signed)
In patient's room for d/c, pt reports feeling itchy after administration of antibiotics. Reports he gets "heat rashes" sometimes. PA Darl Pikes notified.

## 2021-06-23 NOTE — ED Notes (Signed)
MD Katrinka Blazing at bedside.  Pt noted to have entire body tremors lasting approx 15-20 seconds. Pt noted to be staring at floor, unresponsive to voice. Pt assisted to laying position onto stretcher. Pt immediately responsive post symptoms. Report feeling tired and not having eaten breakfast today. Beads of sweat noted on forehead. CBG checked 155.

## 2021-06-23 NOTE — ED Notes (Signed)
cbg 155 

## 2021-06-23 NOTE — ED Provider Notes (Addendum)
[facility] Emergency Department Provider Note  ____________________________________________   Event Date/Time   First MD Initiated Contact with Patient 06/23/21 [phone]     (approximate)  I have reviewed the triage vital signs and the nursing notes.   HISTORY  Chief Complaint SEXUALLY TRANSMITTED DISEASE    HPI Bradley Chavez is a 50 y.o. male presents emergency department with concerns of an STD.  Patient states he has had penile burning and pain with urination.  States he had unprotected sex last week.  Had no fever or chills.  History reviewed. No pertinent past medical history.  There are no problems to display for this patient.   History reviewed. No pertinent surgical history.  Prior to Admission medications   Medication Sig Start Date End Date Taking? Authorizing Provider  HYDROcodone-acetaminophen (NORCO/VICODIN) 5-325 MG tablet Take 1 tablet by mouth every 6 (six) hours as needed for moderate pain. Patient not taking: Reported on 06/13/2021 02/06/21   Tommi Rumps, PA-C    Allergies Patient has no known allergies.  No family history on file.  Social History Social History   Tobacco Use   Smoking status: Never   Smokeless tobacco: Never  Substance Use Topics   Alcohol use: Yes   Drug use: No    Review of Systems  Constitutional: No fever/chills Eyes: No visual changes. ENT: No sore throat. Respiratory: Denies cough Cardiovascular: Denies chest pain Gastrointestinal: Denies abdominal pain Genitourinary: Positive for dysuria. Musculoskeletal: Negative for back pain. Skin: Negative for rash. Psychiatric: no mood changes,     ____________________________________________   PHYSICAL EXAM:  VITAL SIGNS: ED Triage Vitals  Enc Vitals Group     BP 06/23/21 0842 (!) 140/97     Pulse Rate 06/23/21 0842 79     Resp 06/23/21 0842 18     Temp 06/23/21 0842 98.2 F (36.8 C)     Temp Source 06/23/21 0842 Oral      SpO2 06/23/21 0842 100 %     Weight 06/23/21 0840 165 lb (74.8 kg)     Height 06/23/21 0840 5\' 6"  (1.676 m)     Head Circumference --      Peak Flow --      Pain Score 06/23/21 0840 0     Pain Loc --      Pain Edu? --      Excl. in GC? --     Constitutional: Alert and oriented. Well appearing and in no acute distress. Eyes: Conjunctivae are normal.  Head: Atraumatic. Nose: No congestion/rhinnorhea. Mouth/Throat: Mucous membranes are moist.   Neck:  supple no lymphadenopathy noted Cardiovascular: Normal rate, regular rhythm. Heart sounds are normal Respiratory: Normal respiratory effort.  No retractions, lungs c t a  GU: deferred by the patient Musculoskeletal: FROM all extremities, warm and well perfused Neurologic:  Normal speech and language.  Skin:  Skin is warm, dry and intact. No rash noted. Psychiatric: Mood and affect are normal. Speech and behavior are normal.  ____________________________________________   LABS (all labs ordered are listed, but only abnormal results are displayed)  Labs Reviewed  URINALYSIS, COMPLETE (UACMP) WITH MICROSCOPIC - Abnormal; Notable for the following components:      Result Value   Color, Urine STRAW (*)    APPearance CLEAR (*)    Specific Gravity, Urine 1.003 (*)    All other components within normal limits  CHLAMYDIA/NGC RT PCR ([facility] ONLY)             ____________________________________________  ____________________________________________  RADIOLOGY    ____________________________________________   PROCEDURES  Procedure(s) performed: No  Procedures    ____________________________________________   INITIAL IMPRESSION / ASSESSMENT AND PLAN / ED COURSE  Pertinent labs & imaging results that were available during my care of the patient were reviewed by me and considered in my medical decision making (see chart for details).   Patient is a 50 year old male presents with concerns of STD.  See HPI physical exam  shows patient to appear well  UA is normal GC/chlamydia is pending  Patient will be empirically treated for his STD exposure.  He will be given Rocephin 500 mg IM and Zithromax 1 g p.o.  He was instructed to follow-up with [facility] department for HIV and syphilis testing if his test returns positive.  He was discharged stable condition.     Bradley Chavez was evaluated in Emergency Department on 06/23/2021 for the symptoms described in the history of present illness. He was evaluated in the context of the global COVID-19 pandemic, which necessitated consideration that the patient might be at risk for infection with the SARS-CoV-2 virus that causes COVID-19. Institutional protocols and algorithms that pertain to the evaluation of patients at risk for COVID-19 are in a state of rapid change based on information released by regulatory bodies including the CDC and federal and state organizations. These policies and algorithms were followed during the patient's care in the ED.    As part of my medical decision making, I reviewed the following data within the electronic MEDICAL RECORD NUMBER Nursing notes reviewed and incorporated, Labs reviewed , Old chart reviewed, Notes from prior ED visits, and [place] Controlled Substance Database  ____________________________________________   FINAL CLINICAL IMPRESSION(S) / ED DIAGNOSES  Final diagnoses:  STD (male)      NEW MEDICATIONS STARTED DURING THIS VISIT:  New Prescriptions   No medications on file     Note:  This document was prepared using Dragon voice recognition software and may include unintentional dictation errors.    Faythe Ghee, PA-C 06/23/21 1007    Phineas Semen, MD 06/23/21 1106    Sherrie Mustache Roselyn Bering, PA-C 06/23/21 1117    Phineas Semen, MD 06/23/21 1350

## 2021-06-23 NOTE — Discharge Instructions (Addendum)
Follow-up with your regular doctor if not improving to 3 days.  Return emergency department worsening.  If your test returns is positive you should follow-up with [facility] department.  If you do not have [place] MyChart, please sign up for this service as this is how you will get your test results.

## 2021-06-24 LAB — RPR: RPR Ser Ql: NONREACTIVE

## 2021-08-29 ENCOUNTER — Emergency Department
Admission: EM | Admit: 2021-08-29 | Discharge: 2021-08-29 | Disposition: A | Discharge status: Home / Self Care | Payer: Self-pay | Attending: Emergency Medicine | Admitting: Emergency Medicine

## 2021-08-29 ENCOUNTER — Other Ambulatory Visit: Payer: Self-pay

## 2021-08-29 DIAGNOSIS — Z202 Contact with and (suspected) exposure to infections with a predominantly sexual mode of transmission: Secondary | ICD-10-CM | POA: Insufficient documentation

## 2021-08-29 DIAGNOSIS — Z5321 Procedure and treatment not carried out due to patient leaving prior to being seen by health care provider: Secondary | ICD-10-CM | POA: Insufficient documentation

## 2021-08-29 NOTE — ED Notes (Signed)
Pt called and no answer.  

## 2021-08-29 NOTE — ED Triage Notes (Signed)
Pt stating when he urinates that is a burning sensation. Pt stating needs STD check. Last intercourse was a week ago with multiple partners. Pt A&Ox4. VSS

## 2021-08-30 ENCOUNTER — Ambulatory Visit: Payer: Self-pay | Admitting: Physician Assistant

## 2021-08-30 DIAGNOSIS — Z299 Encounter for prophylactic measures, unspecified: Secondary | ICD-10-CM

## 2021-08-30 DIAGNOSIS — Z113 Encounter for screening for infections with a predominantly sexual mode of transmission: Secondary | ICD-10-CM

## 2021-08-30 LAB — GRAM STAIN

## 2021-08-30 MED ORDER — DOXYCYCLINE HYCLATE 100 MG PO TABS: 100.0000 mg | ORAL_TABLET | Freq: Two times a day (BID) | ORAL | 0 refills | Status: AC

## 2021-09-01 ENCOUNTER — Encounter: Payer: Self-pay | Admitting: Physician Assistant

## 2021-09-01 NOTE — Progress Notes (Signed)
[facility] Department STI clinic/screening visit  Subjective:  Bradley Chavez is a 50 y.o. male being seen today for an STI screening visit. The patient reports they do have symptoms.    Patient has the following medical conditions:  There are no problems to display for this patient.    Chief Complaint  Patient presents with   SEXUALLY TRANSMITTED DISEASE    screening    HPI  Patient reports that he has had dysuria for 5 days.  Denies chronic conditions, other symptoms, and regular medicines.  Reports last HIV test was 7-8 years ago  and last void prior to sample collection for Gram stain was about 1 hr  ago.   Screening for MPX risk: Does the patient have an unexplained rash? No Is the patient MSM? No Does the patient endorse multiple sex partners or anonymous sex partners? No Did the patient have close or sexual contact with a person diagnosed with MPX? No Has the patient traveled outside the [place] where MPX is endemic? No Is there a high clinical suspicion for MPX-- evidenced by one of the following No  -Unlikely to be chickenpox  -Lymphadenopathy  -Rash that present in same phase of evolution on any given body part   See flowsheet for further details and programmatic requirements.    The following portions of the patient's history were reviewed and updated as appropriate: allergies, current medications, past medical history, past social history, past surgical history and problem list.  Objective:  There were no vitals filed for this visit.  Physical Exam Constitutional:      General: He is not in acute distress.    Appearance: Normal appearance.  HENT:     Head: Normocephalic and atraumatic.     Comments: No nits,lice, or hair loss. No cervical, supraclavicular or axillary adenopathy.     Mouth/Throat:     Mouth: Mucous membranes are moist.     Pharynx: Oropharynx is clear. No oropharyngeal exudate or posterior oropharyngeal erythema.  Eyes:      Conjunctiva/sclera: Conjunctivae normal.  Pulmonary:     Effort: Pulmonary effort is normal.  Abdominal:     Palpations: Abdomen is soft. There is no mass.     Tenderness: There is no abdominal tenderness. There is no guarding or rebound.  Genitourinary:    Penis: Normal.      Testes: Normal.     Comments: Pubic area without nits, lice, hair loss, edema, erythema, lesions and inguinal adenopathy. Penis circumcised without rash, lesions and discharge at meatus. Testicles descended bilaterally,nt, no masses or edema.  Musculoskeletal:     Cervical back: Neck supple. No tenderness.  Skin:    General: Skin is warm and dry.     Findings: No bruising, erythema, lesion or rash.  Neurological:     Mental Status: He is alert and oriented to person, place, and time.  Psychiatric:        Mood and Affect: Mood normal.        Behavior: Behavior normal.        Thought Content: Thought content normal.        Judgment: Judgment normal.      Assessment and Plan:  Bradley Chavez is a 50 y.o. male presenting to the [facility] Department for STI screening  1. Screening for STD (sexually transmitted disease) Patient into clinic with symptoms. Patient declines blood work today. Reviewed with patient Gram stain results.  Rec condoms with all sex. Await test  results.  Counseled that RN will call if needs to RTC for treatment once results are back.  - Gram stain - Gonococcus culture  2. Prophylactic measure Due to patient's symptoms and risk with treat to cover for NGU with Doxycycline 100 mg #14 1 po BID for 7 days. No sex for 14 days and until after partner completes treatment. Call with questions or concerns. - doxycycline (VIBRA-TABS) 100 MG tablet; Take 1 tablet (100 mg total) by mouth 2 (two) times daily for 7 days.  Dispense: 14 tablet; Refill: 0     No follow-ups on file.  No future appointments.  Matt Holmes, PA

## 2021-09-01 NOTE — Progress Notes (Signed)
Chart reviewed by Pharmacist  Suzanne Walker PharmD, Contract Pharmacist at [facility]  

## 2021-09-04 LAB — GONOCOCCUS CULTURE

## 2021-12-17 ENCOUNTER — Other Ambulatory Visit: Payer: Self-pay

## 2021-12-17 ENCOUNTER — Emergency Department
Admission: EM | Admit: 2021-12-17 | Discharge: 2021-12-17 | Disposition: A | Discharge status: Home / Self Care | Payer: Self-pay | Attending: Emergency Medicine | Admitting: Emergency Medicine

## 2021-12-17 DIAGNOSIS — N342 Other urethritis: Secondary | ICD-10-CM | POA: Insufficient documentation

## 2021-12-17 LAB — URINALYSIS, ROUTINE W REFLEX MICROSCOPIC
Bacteria, UA: NONE SEEN
Bilirubin Urine: NEGATIVE
Glucose, UA: NEGATIVE mg/dL
Hgb urine dipstick: NEGATIVE
Ketones, ur: NEGATIVE mg/dL
Leukocytes,Ua: NEGATIVE
Nitrite: NEGATIVE
Protein, ur: 30 mg/dL — AB
Specific Gravity, Urine: 1.013 (ref 1.005–1.030)
Squamous Epithelial / HPF: NONE SEEN (ref 0–5)
pH: 6 (ref 5.0–8.0)

## 2021-12-17 LAB — CHLAMYDIA/NGC RT PCR (ARMC ONLY)
Chlamydia Tr: NOT DETECTED
N gonorrhoeae: NOT DETECTED

## 2021-12-17 MED ORDER — AZITHROMYCIN 500 MG PO TABS
1000.0000 mg | ORAL_TABLET | Freq: Once | ORAL | Status: AC
  Administered 2021-12-17: 1000 mg via ORAL
  Filled 2021-12-17: qty 2

## 2021-12-17 MED ORDER — CEFTRIAXONE SODIUM 1 G IJ SOLR
500.0000 mg | Freq: Once | INTRAMUSCULAR | Status: AC
  Administered 2021-12-17: 500 mg via INTRAMUSCULAR
  Filled 2021-12-17: qty 10

## 2021-12-17 NOTE — Discharge Instructions (Signed)
Follow up at the health department if not improving over the week.  No intercourse for at least a week.   All partners need to be tested/treated before intercourse.

## 2021-12-17 NOTE — ED Provider Notes (Signed)
°  Emergency Medicine Provider Triage Evaluation Note  Bradley Chavez , a 50 y.o.male,  was evaluated in triage.  Pt complains of dysuria.  Patient states that this been going on for the past 2 to 3 days.  He says it feels like a chlamydia infection, which he has had in the past.  Endorses recent sexual exposures.  He does not want any additional STD testing at this time.  Denies discharge.  Denies fever/chills, abdominal pain, flank pain, hematuria, or chest pain/shortness of breath.   Review of Systems  Positive: Dysuria Negative: Denies fever, chest pain, vomiting  Physical Exam   Vitals:   12/17/21 1312 12/17/21 1315  BP: (!) 151/88   Pulse: (!) 108   Resp: 18   Temp:  98.9 F (37.2 C)  SpO2: 99%    Gen:   Awake, no distress   Resp:  Normal effort  MSK:   Moves extremities without difficulty  Other:    Medical Decision Making  Given the patient's initial medical screening exam, the following diagnostic evaluation has been ordered. The patient will be placed in the appropriate treatment space, once one is available, to complete the evaluation and treatment. I have discussed the plan of care with the patient and I have advised the patient that an ED physician or mid-level practitioner will reevaluate their condition after the test results have been received, as the results may give them additional insight into the type of treatment they may need.    Diagnostics: UA, GC/chlamydia PCR  Treatments: none immediately   Varney Daily, PA 12/17/21 1316    Jene Every, MD 12/17/21 [phone]

## 2021-12-17 NOTE — ED Provider Notes (Signed)
[facility] Emergency Department Provider Note  ____________________________________________  Time seen: Approximately 3:14 PM  I have reviewed the triage vital signs and the nursing notes.   HISTORY  Chief Complaint Dysuria    HPI Bradley Chavez is a 50 y.o. male presents to the emergency department for treatment and evaluation of dysuria x 2 days. Feels similar to previous chlamydia. Denies abdominal pain or fever.  History reviewed. No pertinent past medical history.  There are no problems to display for this patient.   Past Surgical History:  Procedure Laterality Date   HAND SURGERY Right     Prior to Admission medications   Medication Sig Start Date End Date Taking? Authorizing Provider  HYDROcodone-acetaminophen (NORCO/VICODIN) 5-325 MG tablet Take 1 tablet by mouth every 6 (six) hours as needed for moderate pain. Patient not taking: Reported on 06/13/2021 02/06/21   Tommi Rumps, PA-C    Allergies Patient has no known allergies.  No family history on file.  Social History Social History   Tobacco Use   Smoking status: Never   Smokeless tobacco: Never  Substance Use Topics   Alcohol use: Yes    Comment: 1-2 beers a day   Drug use: Not Currently    Types: Marijuana    Review of Systems Constitutional: Negative for fever. Respiratory: Negative for shortness of breath or cough. Gastrointestinal: Negative for abdominal pain; negative for nausea , negative for vomiting. Genitourinary: Positive for dysuria , negative for penile discharge. Musculoskeletal: Negative for back pain. Skin: Negative for acute skin changes/rash/lesion. ____________________________________________   PHYSICAL EXAM:  VITAL SIGNS: ED Triage Vitals  Enc Vitals Group     BP 12/17/21 1312 (!) 151/88     Pulse Rate 12/17/21 1312 (!) 108     Resp 12/17/21 1312 18     Temp 12/17/21 1315 98.9 F (37.2 C)     Temp Source 12/17/21 1315 Oral     SpO2  12/17/21 1312 99 %     Weight 12/17/21 1313 165 lb (74.8 kg)     Height 12/17/21 1313 5\' 6"  (1.676 m)     Head Circumference --      Peak Flow --      Pain Score 12/17/21 1313 3     Pain Loc --      Pain Edu? --      Excl. in GC? --     Constitutional: Alert and oriented. Well appearing and in no acute distress. Eyes: Conjunctivae are normal. Head: Atraumatic. Nose: No congestion/rhinnorhea. Mouth/Throat: Mucous membranes are moist. Respiratory: Normal respiratory effort.  No retractions. Gastrointestinal: Bowel sounds active x 4; Abdomen is soft without rebound or guarding. Genitourinary: exam deferred. Musculoskeletal: No extremity tenderness nor edema.  Neurologic:  Normal speech and language. No gross focal neurologic deficits are appreciated. Speech is normal. No gait instability. Skin:  Skin is warm, dry and intact. No rash noted on exposed skin. Psychiatric: Mood and affect are normal. Speech and behavior are normal.  ____________________________________________   LABS (all labs ordered are listed, but only abnormal results are displayed)  Labs Reviewed  URINALYSIS, ROUTINE W REFLEX MICROSCOPIC - Abnormal; Notable for the following components:      Result Value   Color, Urine YELLOW (*)    APPearance CLEAR (*)    Protein, ur 30 (*)    All other components within normal limits  CHLAMYDIA/NGC RT PCR ([facility] ONLY)             ____________________________________________  RADIOLOGY  Not indicated. ____________________________________________  Procedures  ____________________________________________   INITIAL IMPRESSION / ASSESSMENT AND PLAN / ED COURSE  50 year old male presents with dysuria. See HPI. Will treat empirically with Rocephin and azithromycin.   Pertinent labs & imaging results that were available during my care of the patient were reviewed by me and considered in my medical decision making (see chart for  details).  ____________________________________________   FINAL CLINICAL IMPRESSION(S) / ED DIAGNOSES  Final diagnoses:  Urethritis    Note:  This document was prepared using Dragon voice recognition software and may include unintentional dictation errors.   Chinita Pester, FNP 12/17/21 2119    Jene Every, MD 12/19/21 1244

## 2021-12-17 NOTE — ED Triage Notes (Signed)
Pt has been having burning when he urinates for the past couple days- pt states this has happened before and it was chlamydia- pt denies discharge

## 2021-12-17 NOTE — ED Notes (Signed)
Pt. Left prior to RN assessment. Pt. Refused d/c VS.

## 2022-04-01 IMAGING — DX DG ANKLE COMPLETE 3+V*R*
3 series · 3 of 3 positions shown · non-contrast
Comparison: 12/07/2006

CLINICAL DATA: 49-year-old male with right ankle pain

EXAM:
RIGHT ANKLE - COMPLETE 3+ VIEW

[ankle ap]
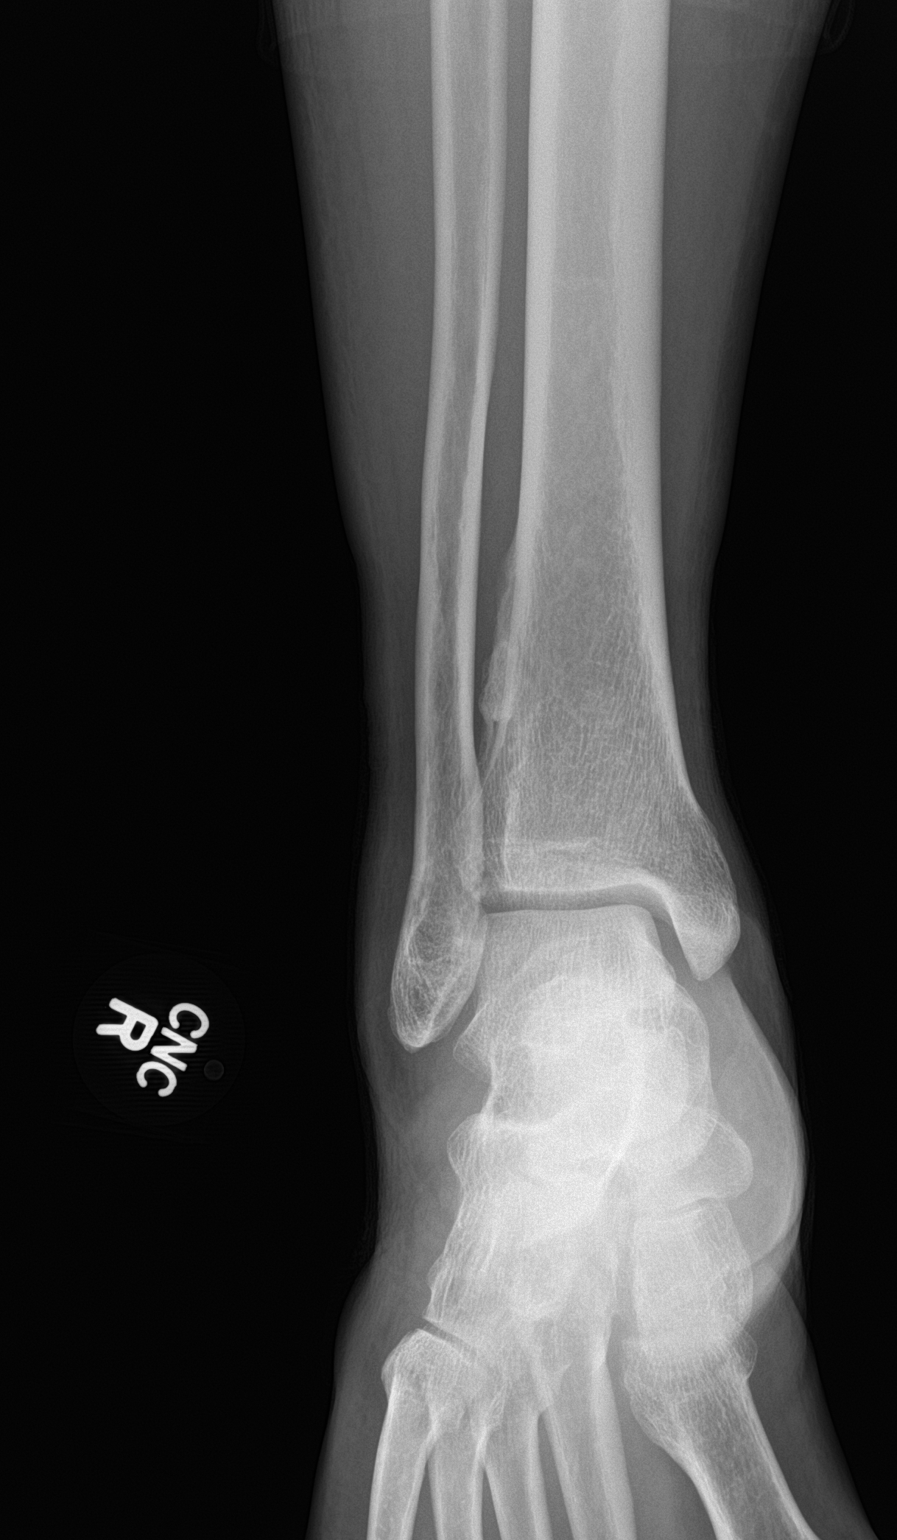

[ankle obl]
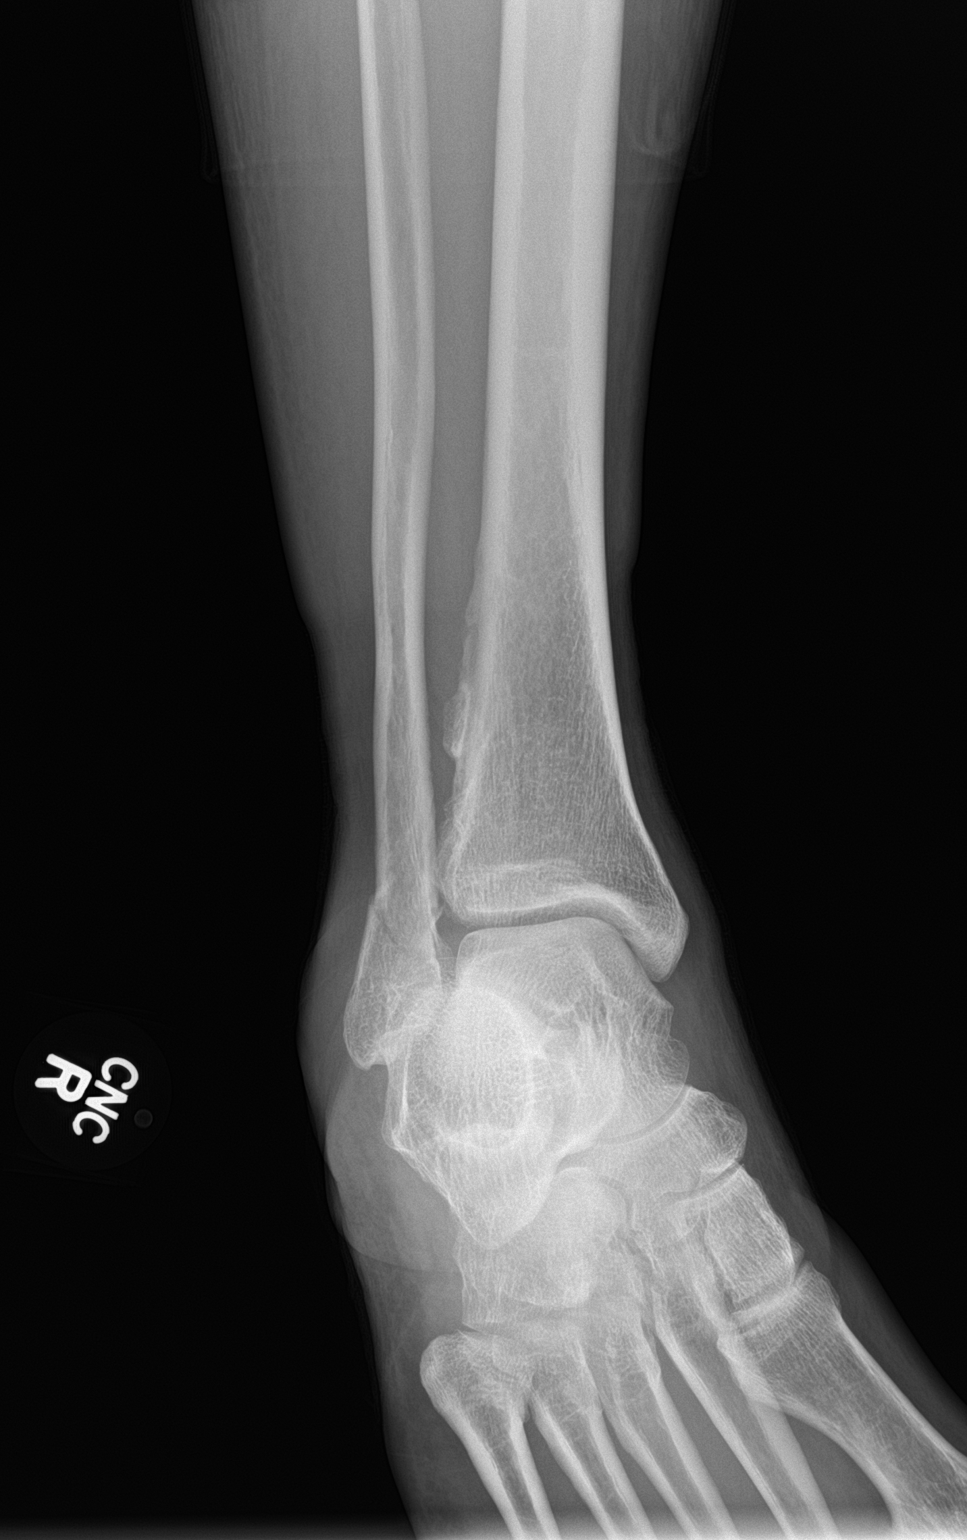

[ankle lat]
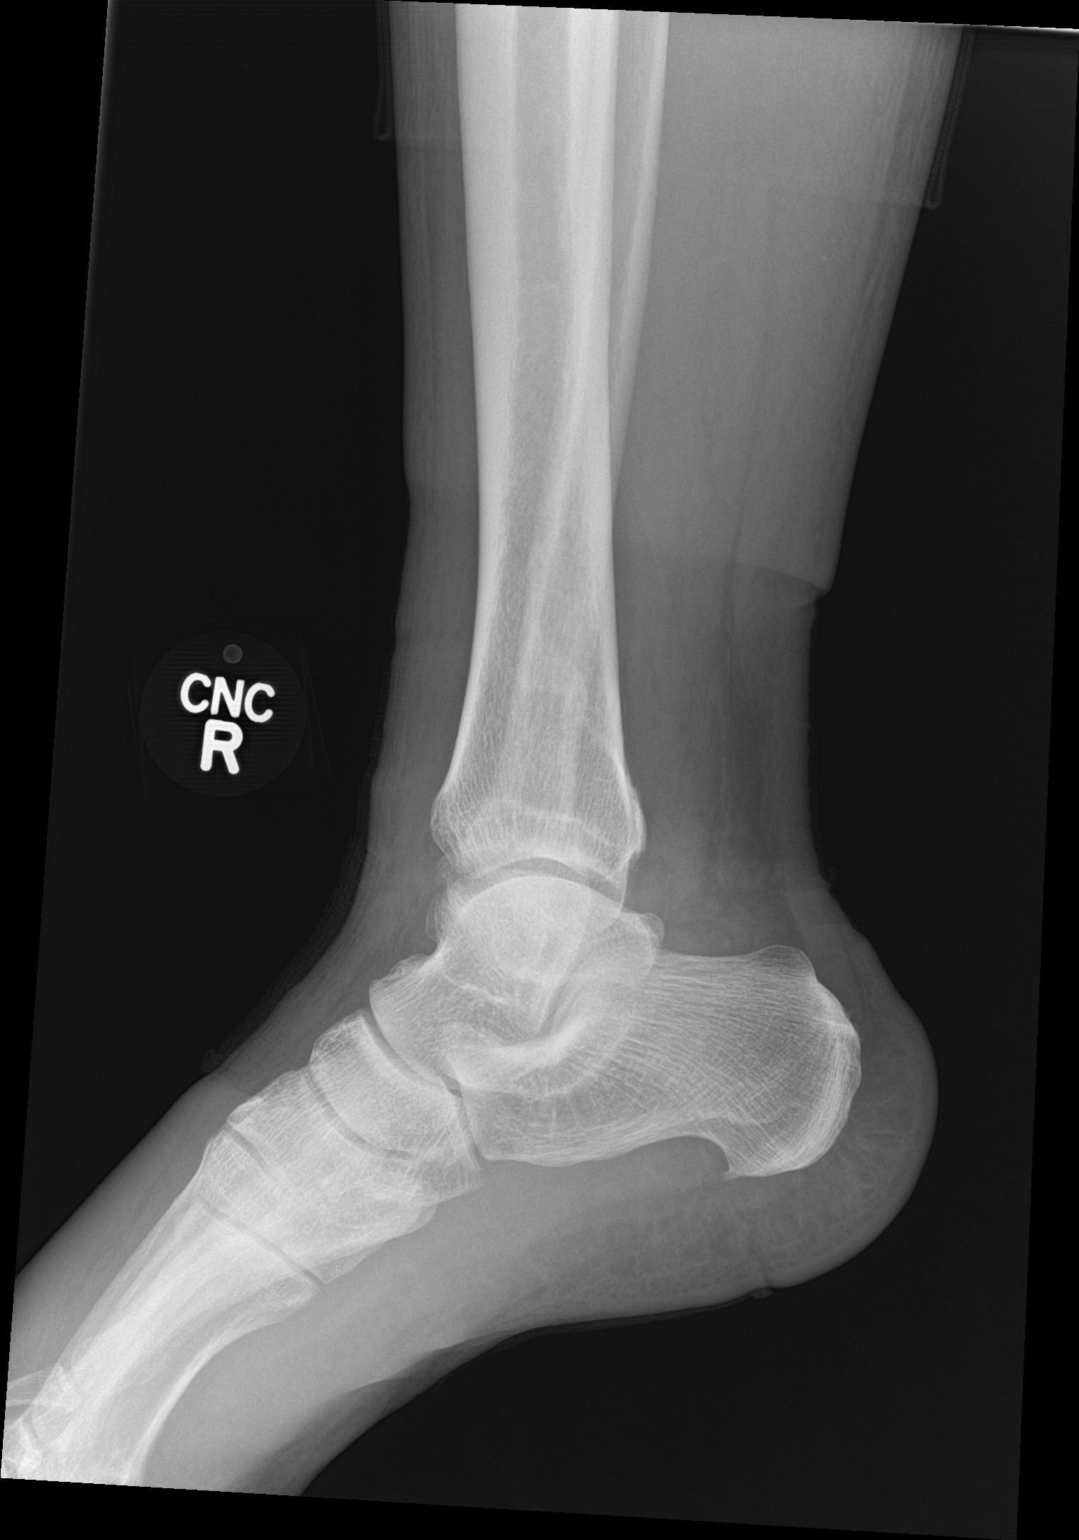

[3 of 3 positions shown; findings below may reference images not displayed]

FINDINGS: Acute distal fibular fracture at the level of the ankle mortise. No
significant displacement. Soft tissue swelling.

Degenerative changes of the ankle. No radiopaque foreign body. Ankle
mortise appears congruent.
IMPRESSION: Acute fracture of the distal fibula at the level of the ankle
mortise.

## 2022-11-27 ENCOUNTER — Ambulatory Visit: Payer: Self-pay | Admitting: Family Medicine

## 2022-12-01 ENCOUNTER — Emergency Department
Admission: EM | Admit: 2022-12-01 | Discharge: 2022-12-01 | Disposition: A | Discharge status: Home / Self Care | Payer: Self-pay | Source: Home / Self Care

## 2022-12-07 ENCOUNTER — Ambulatory Visit: Payer: Self-pay

## 2022-12-14 ENCOUNTER — Other Ambulatory Visit: Payer: Self-pay

## 2022-12-14 ENCOUNTER — Emergency Department
Admission: EM | Admit: 2022-12-14 | Discharge: 2022-12-14 | Disposition: A | Discharge status: Home / Self Care | Payer: Commercial Managed Care - HMO | Attending: Emergency Medicine | Admitting: Emergency Medicine

## 2022-12-14 DIAGNOSIS — R300 Dysuria: Secondary | ICD-10-CM | POA: Insufficient documentation

## 2022-12-14 LAB — URINALYSIS, ROUTINE W REFLEX MICROSCOPIC
Bacteria, UA: NONE SEEN
Bilirubin Urine: NEGATIVE
Glucose, UA: NEGATIVE mg/dL
Hgb urine dipstick: NEGATIVE
Ketones, ur: NEGATIVE mg/dL
Leukocytes,Ua: NEGATIVE
Nitrite: NEGATIVE
Protein, ur: 100 mg/dL — AB
Specific Gravity, Urine: 1.019 (ref 1.005–1.030)
Squamous Epithelial / HPF: NONE SEEN /HPF (ref 0–5)
pH: 7 (ref 5.0–8.0)

## 2022-12-14 LAB — CHLAMYDIA/NGC RT PCR (ARMC ONLY)
Chlamydia Tr: NOT DETECTED
N gonorrhoeae: NOT DETECTED

## 2022-12-14 MED ORDER — AZITHROMYCIN 500 MG PO TABS
1000.0000 mg | ORAL_TABLET | Freq: Once | ORAL | Status: AC
  Administered 2022-12-14: 1000 mg via ORAL
  Filled 2022-12-14: qty 2

## 2022-12-14 MED ORDER — CEFTRIAXONE SODIUM 250 MG IJ SOLR: 250.0000 mg | Freq: Once | INTRAMUSCULAR | Status: DC

## 2022-12-14 NOTE — ED Provider Triage Note (Signed)
Emergency Medicine Provider Triage Evaluation Note  Bradley Chavez , a 51 y.o. male  was evaluated in triage.  Pt complains of dysuria, recent new sex partner.  Review of Systems  Positive:  Negative:   Physical Exam  BP (!) 153/111 (BP Location: Left Arm)   Pulse 100   Temp 98.2 F (36.8 C) (Oral)   Resp 20   Ht 5\' 6"  (1.676 m)   Wt 74.8 kg   SpO2 98%   BMI 26.63 kg/m  Gen:   Awake, no distress   Resp:  Normal effort  MSK:   Moves extremities without difficulty  Other:    Medical Decision Making  Medically screening exam initiated at 4:35 PM.  Appropriate orders placed.  [name] was informed that the remainder of the evaluation will be completed by another provider, this initial triage assessment does not replace that evaluation, and the importance of remaining in the ED until their evaluation is complete.     [name], PA-C 12/14/22 1636

## 2022-12-14 NOTE — ED Provider Notes (Signed)
   [facility] Provider Note    Event Date/Time   First MD Initiated Contact with Patient 12/14/22 1834     (approximate)  History   Chief Complaint: Penile Discharge  HPI  Bradley Chavez is a 51 y.o. male presents to the emergency department for penile burning.  According to the patient for the past week or so he has been experiencing some burning when he urinates.  Patient has taken over-the-counter urinary tract infection medications without relief but continues to have burning.  Patient states he has been sexually active.  Denies any significant penile discharge.  Physical Exam   Triage Vital Signs: ED Triage Vitals  Enc Vitals Group     BP 12/14/22 1632 (!) 153/111     Pulse Rate 12/14/22 1632 100     Resp 12/14/22 1632 20     Temp 12/14/22 1632 98.2 F (36.8 C)     Temp Source 12/14/22 1632 Oral     SpO2 12/14/22 1632 98 %     Weight 12/14/22 1631 165 lb (74.8 kg)     Height 12/14/22 1631 5\' 6"  (1.676 m)     Head Circumference --      Peak Flow --      Pain Score 12/14/22 1631 6     Pain Loc --      Pain Edu? --      Excl. in GC? --     Most recent vital signs: Vitals:   12/14/22 1632  BP: (!) 153/111  Pulse: 100  Resp: 20  Temp: 98.2 F (36.8 C)  SpO2: 98%    General: Awake, no distress.  CV:  Good peripheral perfusion. Resp:  Normal effort.   Abd:  No distention.  Soft,   ED Results / Procedures / Treatments   MEDICATIONS ORDERED IN ED: Medications  azithromycin (ZITHROMAX) tablet 1,000 mg (has no administration in time range)     IMPRESSION / MDM / ASSESSMENT AND PLAN / ED COURSE  I reviewed the triage vital signs and the nursing notes.  Patient's presentation is most consistent with acute, uncomplicated illness.  Patient presents emergency department for 1 week of penile burning worse with urination.  Overall patient appears well, no distress.  Patient is sexually active with a new sexual partner.  States he is no  longer going to have intercourse with his partner.  Urinalysis shows no concern for urinary tract infection however given the penile discomfort/burning we will cover with antibiotics to cover for STDs.  Patient states last time he got an injection he passed out he does not want any injections.  We will dose Zithromax while awaiting STD results.  Patient agreeable to plan.  Patient's gonorrhea/chlamydia has resulted negative.  Patient received Zithromax.  Patient follow-up with his doctor.  FINAL CLINICAL IMPRESSION(S) / ED DIAGNOSES   Dysuria    Note:  This document was prepared using Dragon voice recognition software and may include unintentional dictation errors.   12/16/22, MD 12/14/22 [phone]

## 2022-12-14 NOTE — ED Triage Notes (Signed)
Pt reports has some burning pain when he urinates for a little over a week. Pt states he is sexually active and states he only has one partner but he messed up.

## 2022-12-20 ENCOUNTER — Ambulatory Visit: Payer: Self-pay

## 2023-03-01 ENCOUNTER — Ambulatory Visit: Payer: Medicaid Other | Admitting: Family Medicine

## 2023-03-13 ENCOUNTER — Ambulatory Visit: Payer: Medicaid Other
